# Patient Record
Sex: Female | Born: 1970
Health system: Southern US, Community
[De-identification: ages and names within clinical notes are randomized; demographics above are authoritative.]

## PROBLEM LIST (undated history)

## (undated) DIAGNOSIS — Z9289 Personal history of other medical treatment: Secondary | ICD-10-CM

## (undated) DIAGNOSIS — K219 Gastro-esophageal reflux disease without esophagitis: Secondary | ICD-10-CM

## (undated) DIAGNOSIS — F419 Anxiety disorder, unspecified: Secondary | ICD-10-CM

## (undated) DIAGNOSIS — I10 Essential (primary) hypertension: Secondary | ICD-10-CM

## (undated) DIAGNOSIS — Z973 Presence of spectacles and contact lenses: Secondary | ICD-10-CM

## (undated) DIAGNOSIS — Z01419 Encounter for gynecological examination (general) (routine) without abnormal findings: Secondary | ICD-10-CM

## (undated) HISTORY — DX: Personal history of other medical treatment: Z92.89

## (undated) HISTORY — PX: CHOLECYSTECTOMY: SHX55

## (undated) HISTORY — PX: BREAST BIOPSY: SHX20

## (undated) HISTORY — DX: Presence of spectacles and contact lenses: Z97.3

## (undated) HISTORY — DX: Gastro-esophageal reflux disease without esophagitis: K21.9

## (undated) HISTORY — DX: Essential (primary) hypertension: I10

## (undated) HISTORY — DX: Encounter for gynecological examination (general) (routine) without abnormal findings: Z01.419

## (undated) HISTORY — PX: WISDOM TOOTH EXTRACTION: SHX21

---

## 2003-10-31 ENCOUNTER — Encounter (INDEPENDENT_AMBULATORY_CARE_PROVIDER_SITE_OTHER): Payer: Self-pay | Admitting: Specialist

## 2003-10-31 ENCOUNTER — Observation Stay (HOSPITAL_COMMUNITY): Admission: RE | Admit: 2003-10-31 | Discharge: 2003-11-01 | Payer: Self-pay | Admitting: General Surgery

## 2003-10-31 ENCOUNTER — Encounter (INDEPENDENT_AMBULATORY_CARE_PROVIDER_SITE_OTHER): Payer: Self-pay | Admitting: *Deleted

## 2004-02-04 ENCOUNTER — Other Ambulatory Visit: Admission: RE | Admit: 2004-02-04 | Discharge: 2004-02-04 | Payer: Self-pay | Admitting: Obstetrics and Gynecology

## 2005-05-12 ENCOUNTER — Other Ambulatory Visit: Admission: RE | Admit: 2005-05-12 | Discharge: 2005-05-12 | Payer: Self-pay | Admitting: Obstetrics and Gynecology

## 2009-12-28 HISTORY — PX: COLONOSCOPY: SHX174

## 2010-07-28 ENCOUNTER — Encounter: Payer: Self-pay | Admitting: Gastroenterology

## 2010-08-27 ENCOUNTER — Ambulatory Visit: Payer: Self-pay | Admitting: Gastroenterology

## 2010-08-27 DIAGNOSIS — R197 Diarrhea, unspecified: Secondary | ICD-10-CM | POA: Insufficient documentation

## 2010-08-27 DIAGNOSIS — R1032 Left lower quadrant pain: Secondary | ICD-10-CM | POA: Insufficient documentation

## 2010-08-27 LAB — CONVERTED CEMR LAB
ALT: 27 units/L (ref 0–35)
AST: 22 units/L (ref 0–37)
Albumin: 4.1 g/dL (ref 3.5–5.2)
Alkaline Phosphatase: 114 units/L (ref 39–117)
BUN: 10 mg/dL (ref 6–23)
CO2: 29 meq/L (ref 19–32)
CRP, High Sensitivity: 16.06 — ABNORMAL HIGH (ref 0.00–5.00)
Calcium: 9.6 mg/dL (ref 8.4–10.5)
Chloride: 104 meq/L (ref 96–112)
Creatinine, Ser: 0.8 mg/dL (ref 0.4–1.2)
GFR calc non Af Amer: 91.41 mL/min (ref >60)
Glucose, Bld: 87 mg/dL (ref 70–99)
Potassium: 4.4 meq/L (ref 3.5–5.1)
Sed Rate: 35 mm/hr — ABNORMAL HIGH (ref 0–22)
Sodium: 141 meq/L (ref 135–145)
TSH: 1.13 microintl units/mL (ref 0.35–5.50)
Total Bilirubin: 0.7 mg/dL (ref 0.3–1.2)
Total Protein: 7.4 g/dL (ref 6.0–8.3)

## 2010-08-29 ENCOUNTER — Ambulatory Visit: Payer: Self-pay | Admitting: Gastroenterology

## 2010-09-03 ENCOUNTER — Encounter: Payer: Self-pay | Admitting: Gastroenterology

## 2011-01-27 NOTE — Letter (Signed)
Summary: Patient Notice-Hyperplastic Polyps  [facility]  [address] [place], [place] [address]   Phone: [phone]  Fax: [phone]        September 03, 2010 MRN: [id number]    Jennifer Leonard [address] [place], [place]  [address]    Dear Ms. Ziesmer,  I am pleased to inform you that the colon polyp(s) removed during your recent colonoscopy was (were) found to be hyperplastic. These types of polyps are NOT pre-cancerous. The colonic biopsies were normal.  It is my recommendation that you have a repeat colonoscopy examination at age 33 years for routine colorectal cancer screening.  Should you develop new or worsening symptoms of abdominal pain, bowel habit changes or bleeding from the rectum or bowels, please schedule an evaluation with either your primary care physician or with me.  Continue treatment plan as outlined the day of your exam.  Please call us if you are having persistent problems or have questions about your condition that have not been fully answered at this time.  Sincerely,  Meryl Dare MD [facility]  This letter has been electronically signed by your physician.  Appended Document: Patient Notice-Hyperplastic Polyps letter mailed

## 2011-01-27 NOTE — Letter (Signed)
Summary: [facility] Instructions  [facility]  [address] [place], [place] [address]   Phone: [phone]  Fax: [phone]       Jennifer Leonard    03-Jul-1971    MRN: [id number]        Procedure Day /Date: Friday September 2nd, 2011     Arrival Time: 10:00am      Procedure Time: 11:00am     Location of Procedure:                    _x _   [facility]oscopy Center (4th Floor)                        PREPARATION FOR COLONOSCOPY WITH MOVIPREP   Starting 5 days prior to your procedure today do not eat nuts, seeds, popcorn, corn, beans, peas,  salads, or any raw vegetables.  Do not take any fiber supplements (e.g. Metamucil, Citrucel, and Benefiber).  THE DAY BEFORE YOUR PROCEDURE         DATE: 08/28/10  DAY: Thursday  1.  Drink clear liquids the entire day-NO SOLID FOOD  2.  Do not drink anything colored red or purple.  Avoid juices with pulp.  No orange juice.  3.  Drink at least 64 oz. (8 glasses) of fluid/clear liquids during the day to prevent dehydration and help the prep work efficiently.  CLEAR LIQUIDS INCLUDE: Water Jello Ice Popsicles Tea (sugar ok, no milk/cream) Powdered fruit flavored drinks Coffee (sugar ok, no milk/cream) Gatorade Juice: apple, white grape, white cranberry  Lemonade Clear bullion, consomm, broth Carbonated beverages (any kind) Strained chicken noodle soup Hard Candy                             4.  In the morning, mix first dose of MoviPrep solution:    Empty 1 Pouch A and 1 Pouch B into the disposable container    Add lukewarm drinking water to the top line of the container. Mix to dissolve    Refrigerate (mixed solution should be used within 24 hrs)  5.  Begin drinking the prep at 5:00 p.m. The MoviPrep container is divided by 4 marks.   Every 15 minutes drink the solution down to the next mark (approximately 8 oz) until the full liter is complete.   6.  Follow completed prep with 16 oz of clear liquid of your  choice (Nothing red or purple).  Continue to drink clear liquids until bedtime.  7.  Before going to bed, mix second dose of MoviPrep solution:    Empty 1 Pouch A and 1 Pouch B into the disposable container    Add lukewarm drinking water to the top line of the container. Mix to dissolve    Refrigerate  THE DAY OF YOUR PROCEDURE      DATE: 08/29/10 DAY: Friday  Beginning at 6:00 a.m. (5 hours before procedure):         1. Every 15 minutes, drink the solution down to the next mark (approx 8 oz) until the full liter is complete.  2. Follow completed prep with 16 oz. of clear liquid of your choice.    3. You may drink clear liquids until 9:00am (2 HOURS BEFORE PROCEDURE).   MEDICATION INSTRUCTIONS  Unless otherwise instructed, you should take regular prescription medications with a small sip of water   as early as possible the morning of  your procedure.        OTHER INSTRUCTIONS  You will need a responsible adult at least 40 years of age to accompany you and drive you home.   This person must remain in the waiting room during your procedure.  Wear loose fitting clothing that is easily removed.  Leave jewelry and other valuables at home.  However, you may wish to bring a book to read or  an iPod/MP3 player to listen to music as you wait for your procedure to start.  Remove all body piercing jewelry and leave at home.  Total time from sign-in until discharge is approximately 2-3 hours.  You should go home directly after your procedure and rest.  You can resume normal activities the  day after your procedure.  The day of your procedure you should not:   Drive   Make legal decisions   Operate machinery   Drink alcohol   Return to work  You will receive specific instructions about eating, activities and medications before you leave.    The above instructions have been reviewed and explained to me by   Estill Bamberg.     I fully understand and can verbalize these  instructions _____________________________ Date _________

## 2011-01-27 NOTE — Assessment & Plan Note (Signed)
Summary: POSS DIVERTICULOSIS/JMS   History of Present Illness Visit Type: Initial Visit Primary GI MD: Elie Goody MD [facility] Primary Provider: Marcelle Overlie, MD Chief Complaint: Patient developed some diarrhea and abdominal pain, she was seen at urgent care. Her problems started when she started a diet and eating better. She ate some almonds and her abdominal pain starts after she eats them.  History of Present Illness:   This is a 40 year old female with a lifelong history of intermittent, urgent, postprandial diarrhea. Her symptoms worsened following her cholecystectomy in 2004. Over the past several weeks, she has had recurrent episodes of left lower quadrant pain and occasional right lower quadrant pain associated with bloating. [facility] was [facility] on August 1st where an acute abdominal series, CBC and urinalysis were unremarkable and she was treated with an spasmodic. She states that almonds precipitated left lower quadrant pain on 2 occasions.   GI Review of Systems    Reports abdominal pain and  bloating.     Location of  Abdominal pain: LLQ.    Denies acid reflux, belching, chest pain, dysphagia with liquids, dysphagia with solids, heartburn, loss of appetite, nausea, vomiting, vomiting blood, weight loss, and  weight gain.      Reports diarrhea.     Denies anal fissure, black tarry stools, change in bowel habit, constipation, diverticulosis, fecal incontinence, heme positive stool, hemorrhoids, irritable bowel syndrome, jaundice, light color stool, liver problems, rectal bleeding, and  rectal pain. Preventive Screening-Counseling & Management  Alcohol-Tobacco     Smoking Status: never      Drug Use:  no.     Current Medications (verified): 1)  Hyomax-Sr 0.375 Mg Xr12h-Tab (Hyoscyamine Sulfate) .... Take One By Mouth As Needed 2)  Alprazolam 0.5 Mg Tabs (Alprazolam) .... Take One By Mouth As Needed  Allergies (verified): 1)  ! Sulfa  Past  History:  Past Medical History: Depression  Past Surgical History: Cholecystectomy with IOC 2004  Family History: No FH of Colon Cancer: Family History of Breast Cancer:maternal grandmother, maternal aunt Maternal Grandfather: Lung cancer (smoker)  Social History: Patient has never smoked.  Alcohol Use - yes social Daily Caffeine Use seldom Illicit Drug Use - no Smoking Status:  never Drug Use:  no  Review of Systems       The pertinent positives and negatives are noted as above and in the HPI. All other ROS were reviewed and were negative.   Vital Signs:  Patient profile:   40 year old female Height:      65 inches Weight:      214.4 pounds BMI:     35.81 Pulse rate:   60 / minute Pulse rhythm:   regular BP sitting:   138 / 72  (left arm) Cuff size:   regular  Vitals Entered By: Harlow Mares CMA Duncan Dull) (August 27, 2010 10:24 AM)  Physical Exam  General:  Well developed, well nourished, no acute distress. obese.   Head:  Normocephalic and atraumatic. Eyes:  PERRLA, no icterus. Ears:  Normal auditory acuity. Mouth:  No deformity or lesions, dentition normal. Neck:  Supple; no masses or thyromegaly. Lungs:  Clear throughout to auscultation. Heart:  Regular rate and rhythm; no murmurs, rubs,  or bruits. Abdomen:  Soft, nontender and nondistended. No masses, hepatosplenomegaly or hernias noted. Normal bowel sounds. Rectal:  Normal exam. hemocult negative.   Msk:  Symmetrical with no gross deformities. Normal posture. Pulses:  Normal pulses noted. Extremities:  No  clubbing, cyanosis, edema or deformities noted. Neurologic:  Alert and  oriented x4;  grossly normal neurologically. Cervical Nodes:  No significant cervical adenopathy. Inguinal Nodes:  No significant inguinal adenopathy. Psych:  Alert and cooperative. Normal mood and affect.  Impression & Recommendations:  Problem # 1:  ABDOMINAL PAIN, LEFT LOWER QUADRANT (ICD-789.04) Recurrent left lower quadrant  abdominal pain, associated with bloating and urgent postprandial diarrhea. I suspect she has irritable bowel syndrome. However, her symptoms have worsened and are now associated with pain. Rule out inflammatory bowel disease diverticulosis, and other disorders. The risks, benefits and alternatives to colonoscopy with possible biopsy and possible polypectomy were discussed with the patient and they consent to proceed. The procedure will be scheduled electively. Continue anti-spasmodic twice daily. Avoid almonds and other foods that trigger symptoms. Orders: Colonoscopy (Colon) TLB-CMP (Comprehensive Metabolic Pnl) (80053-COMP) TLB-CRP-High Sensitivity (C-Reactive Protein) (86140-FCRP) TLB-TSH (Thyroid Stimulating Hormone) (84443-TSH) TLB-Sedimentation Rate (ESR) (85652-ESR)  Problem # 2:  DIARRHEA (ICD-787.91) As in problem #1.  Patient Instructions: 1)  Get your labs drawn today in the basement.  2)  Colonoscopy brochure given.  3)  Copy sent to : Marcelle Overlie, MD 4)  The medication list was reviewed and reconciled.  All changed / newly prescribed medications were explained.  A complete medication list was provided to the patient / caregiver.  Prescriptions: MOVIPREP 100 GM  SOLR (PEG-KCL-NACL-NASULF-NA ASC-C) As per prep instructions.  #1 x 0   Entered by:   Christie Nottingham CMA (AAMA)   Authorized by:   Meryl Dare MD [facility]   Signed by:   Meryl Dare MD FACG on 08/27/2010   Method used:   Electronically to        [facility] [address] (retail)       [address]       [place], [place]  [address]       Ph: [phone]       Fax: [phone]   RxID:   [id number]

## 2011-01-27 NOTE — Op Note (Signed)
Summary: Laparoscopic Cholecystectomy    NAME:  Jennifer Leonard, Jennifer Leonard                       ACCOUNT NO.:  [id number]   MEDICAL RECORD NO.:  [id number]                   PATIENT TYPE:  AMB   LOCATION:  DAY                                  FACILITY:  [facility]   PHYSICIAN:  Gita Kudo, M.D.              DATE OF BIRTH:  27-Oct-1971   DATE OF PROCEDURE:  10/31/2003  DATE OF DISCHARGE:                                 OPERATIVE REPORT   PREOPERATIVE DIAGNOSIS:  Gallstones.   POSTOPERATIVE DIAGNOSES:  Gallstones, normal cholangiogram.   OPERATIVE PROCEDURE:  Laparoscopic cholecystectomy with intraoperative  cholangiogram.   SURGEON:  Gita Kudo, M.D.   ASSISTANT:  Ollen Gross. Carolynne Edouard, M.D.   ANESTHESIA:  General endotracheal.   CLINICAL SUMMARY:  A 40 year old lady with abdominal pain and gallbladder  ultrasound showing many small stones.  Liver functions normal except slight  elevation of alkaline phosphatase.   OPERATIVE FINDINGS:  The gallbladder was thin-walled and not acutely  infected or inflamed.  The cystic duct and artery normal in size and  anatomy.  Cholangiogram appeared normal.   OPERATIVE PROCEDURE:  Under satisfactory general endotracheal anesthesia,  having received 1.0 g Ancef, the patient's abdomen prepped and draped in a  standard fashion.  Through a Marcaine-infiltrated skin incision, the  following ports were placed.  Above the umbilicus a transverse incision  made, the midline entered into the peritoneum and controlled with a figure-  of-eight 0 Vicryl suture.  Operating Hasson port inserted and secured.  Good  CO2 pneumoperitoneum established and then camera placed and under direct  vision two #5 ports placed laterally and a second #10 medially.  Lateral  graspers afforded excellent exposure and the cystic duct and artery were  each circumferentially dissected after taking down filmy adhesions of the  gallbladder.  When certain of these structures' anatomy, the  artery was  controlled with multiple clips and a single clip placed in the cystic duct  near the gallbladder.  Incision made in the duct and a percutaneously-placed  catheter used to obtain good cholangiograms and then removed.  The duct was  then controlled with multiple clips and it and the artery transected.  Following this the gallbladder was removed from below upward using  coagulating dissector for hemostasis and dissection.  After the gallbladder  was removed from the liver bed, the bed was made dry by cautery, lavaged  with saline, and noted to be hemostatic.  The gallbladder was then removed  through the umbilical port with a large grasper after the camera had been  moved to the upper port.  There was no spillage or complication.  The  operative site was again checked for hemostasis and lavaged with saline and  suctioned dry.  Ports were removed under direct vision and then the CO2  released.  The midline was closed with a previous figure-of-eight and a  second interrupted 0 Vicryl suture.  All  subcu approximated with 4-0 Vicryl and Steri-Strips used to approximate the  skin.  There were no complications.  Sponge and needle counts correct.  The  patient tolerated the procedure well and went to the recovery room in good  condition without complication.                                               Gita Kudo, M.D.    MRL/MEDQ  D:  10/31/2003  T:  10/31/2003  Job:  [id number]   cc:   Louanna Raw  [address]  [place]  [place] [address]  Fax: [phone]         SP Surgical Pathology - STATUS: Final             By: Morrie Sheldon,       Perform Date: 3 Nov04 00:00  Ordered By: Maryagnes Amos MD , Suszanne Conners          Ordered Date: 3 Nov04 14:45  Facility: [facility]                              Department: CPATH  Service Report Text  [facility]   [address] [place], [place] [address]   [phone]    REPORT OF SURGICAL PATHOLOGY     Case #: [id number]   Patient Name: Jennifer Leonard, Jennifer Leonard   PID: [id number]   Pathologist: Beulah Gandy. Luisa Hart, MD   DOB/Age Mar 02, 1971 (Age: 28) Gender: F   Date Taken: 10/31/2003   Date Received: 10/31/2003    FINAL DIAGNOSIS    ***MICROSCOPIC EXAMINATION AND DIAGNOSIS***    GALLBLADDER: CHRONIC CHOLECYSTITIS AND CHOLESTEROLOSIS.    COMMENT   No gallstones are identified within the gallbladder or specimen   container. (JDP:gt) 11/01/03    gdt   Date Reported: 11/01/2003 Beulah Gandy. Luisa Hart, MD   *** Electronically Signed Out By JDP ***    Clinical information   Gallstones (mw)    specimen(s) obtained   Gallbladder    Gross Description   Size/?Intact: 7.3 x 2.6 x 2 cm, intact   Serosal surface: Pink, smooth   Mucosa/Wall: Mucosa hyperemic, soft with diffuse yellow   streaking; wall 0.2 cm thick   Contents: Bile, no gross stones   Cystic duct: Patent   Block Summary: One block (SW:caf 10/31/03)    cf/

## 2011-01-27 NOTE — Procedures (Signed)
Summary: Colonoscopy  Patient: Jennifer Leonard Note: All result statuses are Final unless otherwise noted.  Tests: (1) Colonoscopy (COL)   COL Colonoscopy           DONE     [facility]     [address]     [place], [place]  [address]           COLONOSCOPY PROCEDURE REPORT     PATIENT:  [name], [name]  MR#:  [id number]     BIRTHDATE:  Apr 29, 1971, 39 yrs. old  GENDER:  female     ENDOSCOPIST:  Judie Petit T. Russella Dar, MD, [facility]           PROCEDURE DATE:  08/29/2010     PROCEDURE:  Colonoscopy with biopsy and snare polypectomy     ASA CLASS:  Class II     INDICATIONS:  1) unexplained diarrhea  2) Abdominal pain     MEDICATIONS:   Fentanyl 75 mcg IV, Versed 7 mg IV     DESCRIPTION OF PROCEDURE:   After the risks benefits and     alternatives of the procedure were thoroughly explained, informed     consent was obtained.  Digital rectal exam was performed and     revealed no abnormalities.   The LB [id number]-[id number] endoscope     was introduced through the anus and advanced to the terminal ileum     which was intubated for a short distance, without limitations.     The quality of the prep was excellent, using MoviPrep.  The     instrument was then slowly withdrawn as the colon was fully     examined.     <<PROCEDUREIMAGES>>     FINDINGS:  A normal appearing cecum, ileocecal valve, and     appendiceal orifice were identified. The ascending, hepatic     flexure, transverse, splenic flexure, descending colon, and rectum     appeared unremarkable. Random biopsies were obtained and sent to     pathology.  The terminal ileum appeared normal. A sessile polyp     was found in the sigmoid colon. It was 5 mm in size. Polyp was     snared without cautery. Retrieval was successful. Retroflexed     views in the rectum revealed no abnormalities.  The time to cecum     =  1  minutes. The scope was then withdrawn (time =  9.25  min)     from the patient and the procedure completed.         COMPLICATIONS:  None           ENDOSCOPIC IMPRESSION:     1) Normal terminal ileum     2) 5 mm sessile polyp in the sigmoid colon     RECOMMENDATIONS:     1) Await pathology results     2) High fiber diet with liberal fluid intake.     3) If the polyp is adenomatous (pre-cancerous), you will need a     repeat colonoscopy in 5 years. Otherwise you should continue to     follow colorectal cancer screening guidelines for "routine risk"     patients with colonoscopy in 10 years.     4) call office next 1-3 days to schedule followup visit in 4     weeks           Brittni Hult T. Russella Dar, MD, [facility]           CC: Marcelle Overlie,  MD           n.     Rosalie DoctorJudie Petit T. Kawena Lyday at 08/29/2010 11:47 AM           Josem Kaufmann, [id number]  Note: An exclamation mark (!) indicates a result that was not dispersed into the flowsheet. Document Creation Date: 08/29/2010 11:47 AM _______________________________________________________________________  (1) Order result status: Final Collection or observation date-time: 08/29/2010 11:41 Requested date-time:  Receipt date-time:  Reported date-time:  Referring Physician:   Ordering Physician: Claudette Head [phone]) Specimen Source:  Source: Launa Grill Order Number: [id number] Lab site:   Appended Document: Colonoscopy     Procedures Next Due Date:    Colonoscopy: 07/2021

## 2011-05-15 NOTE — Op Note (Signed)
NAMEPURVA, VESSELL                       ACCOUNT NO.:  [id number]   MEDICAL RECORD NO.:  [id number]                   PATIENT TYPE:  AMB   LOCATION:  DAY                                  FACILITY:  [facility]   PHYSICIAN:  Gita Kudo, M.D.              DATE OF BIRTH:  07/24/1971   DATE OF PROCEDURE:  10/31/2003  DATE OF DISCHARGE:                                 OPERATIVE REPORT   PREOPERATIVE DIAGNOSIS:  Gallstones.   POSTOPERATIVE DIAGNOSES:  Gallstones, normal cholangiogram.   OPERATIVE PROCEDURE:  Laparoscopic cholecystectomy with intraoperative  cholangiogram.   SURGEON:  Gita Kudo, M.D.   ASSISTANT:  Ollen Gross. Carolynne Edouard, M.D.   ANESTHESIA:  General endotracheal.   CLINICAL SUMMARY:  A 40 year old lady with abdominal pain and gallbladder  ultrasound showing many small stones.  Liver functions normal except slight  elevation of alkaline phosphatase.   OPERATIVE FINDINGS:  The gallbladder was thin-walled and not acutely  infected or inflamed.  The cystic duct and artery normal in size and  anatomy.  Cholangiogram appeared normal.   OPERATIVE PROCEDURE:  Under satisfactory general endotracheal anesthesia,  having received 1.0 g Ancef, the patient's abdomen prepped and draped in a  standard fashion.  Through a Marcaine-infiltrated skin incision, the  following ports were placed.  Above the umbilicus a transverse incision  made, the midline entered into the peritoneum and controlled with a figure-  of-eight 0 Vicryl suture.  Operating Hasson port inserted and secured.  Good  CO2 pneumoperitoneum established and then camera placed and under direct  vision two #5 ports placed laterally and a second #10 medially.  Lateral  graspers afforded excellent exposure and the cystic duct and artery were  each circumferentially dissected after taking down filmy adhesions of the  gallbladder.  When certain of these structures' anatomy, the artery was  controlled with multiple  clips and a single clip placed in the cystic duct  near the gallbladder.  Incision made in the duct and a percutaneously-placed  catheter used to obtain good cholangiograms and then removed.  The duct was  then controlled with multiple clips and it and the artery transected.  Following this the gallbladder was removed from below upward using  coagulating dissector for hemostasis and dissection.  After the gallbladder  was removed from the liver bed, the bed was made dry by cautery, lavaged  with saline, and noted to be hemostatic.  The gallbladder was then removed  through the umbilical port with a large grasper after the camera had been  moved to the upper port.  There was no spillage or complication.  The  operative site was again checked for hemostasis and lavaged with saline and  suctioned dry.  Ports were removed under direct vision and then the CO2  released.  The midline was closed with a previous figure-of-eight and a  second interrupted 0 Vicryl suture.  All  subcu approximated  with 4-0 Vicryl and Steri-Strips used to approximate the  skin.  There were no complications.  Sponge and needle counts correct.  The  patient tolerated the procedure well and went to the recovery room in good  condition without complication.                                               Gita Kudo, M.D.    MRL/MEDQ  D:  10/31/2003  T:  10/31/2003  Job:  [id number]   cc:   Louanna Raw  [address]  [place]  [place] [address]  Fax: [phone]

## 2012-04-04 ENCOUNTER — Encounter (HOSPITAL_COMMUNITY): Payer: Self-pay | Admitting: Emergency Medicine

## 2012-04-04 ENCOUNTER — Emergency Department (HOSPITAL_COMMUNITY)
Admission: EM | Admit: 2012-04-04 | Discharge: 2012-04-04 | Disposition: A | Discharge status: Home / Self Care | Payer: 59 | Attending: Emergency Medicine | Admitting: Emergency Medicine

## 2012-04-04 ENCOUNTER — Emergency Department (HOSPITAL_COMMUNITY): Payer: 59

## 2012-04-04 ENCOUNTER — Other Ambulatory Visit: Payer: Self-pay

## 2012-04-04 DIAGNOSIS — F411 Generalized anxiety disorder: Secondary | ICD-10-CM | POA: Insufficient documentation

## 2012-04-04 DIAGNOSIS — R079 Chest pain, unspecified: Secondary | ICD-10-CM | POA: Insufficient documentation

## 2012-04-04 HISTORY — DX: Anxiety disorder, unspecified: F41.9

## 2012-04-04 LAB — COMPREHENSIVE METABOLIC PANEL
ALT: 19 U/L (ref 0–35)
AST: 20 U/L (ref 0–37)
Albumin: 3.7 g/dL (ref 3.5–5.2)
Alkaline Phosphatase: 121 U/L — ABNORMAL HIGH (ref 39–117)
BUN: 7 mg/dL (ref 6–23)
CO2: 23 mEq/L (ref 19–32)
Calcium: 9.3 mg/dL (ref 8.4–10.5)
Chloride: 106 mEq/L (ref 96–112)
Creatinine, Ser: 0.74 mg/dL (ref 0.50–1.10)
GFR calc Af Amer: >90 mL/min (ref >90)
GFR calc non Af Amer: >90 mL/min (ref >90)
Glucose, Bld: 95 mg/dL (ref 70–99)
Potassium: 3.8 mEq/L (ref 3.5–5.1)
Sodium: 141 mEq/L (ref 135–145)
Total Bilirubin: 0.2 mg/dL — ABNORMAL LOW (ref 0.3–1.2)
Total Protein: 7.2 g/dL (ref 6.0–8.3)

## 2012-04-04 LAB — DIFFERENTIAL
Basophils Absolute: 0.1 10*3/uL (ref 0.0–0.1)
Basophils Relative: 1 % (ref 0–1)
Eosinophils Absolute: 0.3 10*3/uL (ref 0.0–0.7)
Eosinophils Relative: 4 % (ref 0–5)
Lymphocytes Relative: 36 % (ref 12–46)
Lymphs Abs: 2.7 10*3/uL (ref 0.7–4.0)
Monocytes Absolute: 0.5 10*3/uL (ref 0.1–1.0)
Monocytes Relative: 7 % (ref 3–12)
Neutro Abs: 3.9 10*3/uL (ref 1.7–7.7)
Neutrophils Relative %: 52 % (ref 43–77)

## 2012-04-04 LAB — CBC
HCT: 37.7 % (ref 36.0–46.0)
Hemoglobin: 12.6 g/dL (ref 12.0–15.0)
MCH: 28.3 pg (ref 26.0–34.0)
MCHC: 33.4 g/dL (ref 30.0–36.0)
MCV: 84.7 fL (ref 78.0–100.0)
Platelets: 279 10*3/uL (ref 150–400)
RBC: 4.45 MIL/uL (ref 3.87–5.11)
RDW: 13.1 % (ref 11.5–15.5)
WBC: 7.5 10*3/uL (ref 4.0–10.5)

## 2012-04-04 LAB — POCT I-STAT TROPONIN I: Troponin i, poc: 0 ng/mL (ref 0.00–0.08)

## 2012-04-04 LAB — D-DIMER, QUANTITATIVE: D-Dimer, Quant: 0.23 ug/mL-FEU (ref 0.00–0.48)

## 2012-04-04 MED ORDER — NITROGLYCERIN 0.4 MG SL SUBL
0.4000 mg | SUBLINGUAL_TABLET | Freq: Once | SUBLINGUAL | Status: AC
  Administered 2012-04-04: 0.4 mg via SUBLINGUAL
  Filled 2012-04-04: qty 25

## 2012-04-04 NOTE — Discharge Instructions (Signed)
Chest Pain (Nonspecific) It is often hard to give a specific diagnosis for the cause of chest pain. There is always a chance that your pain could be related to something serious, such as a heart attack or a blood clot in the lungs. You need to follow up with your caregiver for further evaluation. CAUSES   Heartburn.   Pneumonia or bronchitis.   Anxiety or stress.   Inflammation around your heart (pericarditis) or lung (pleuritis or pleurisy).   A blood clot in the lung.   A collapsed lung (pneumothorax). It can develop suddenly on its own (spontaneous pneumothorax) or from injury (trauma) to the chest.   Shingles infection (herpes zoster virus).  The chest wall is composed of bones, muscles, and cartilage. Any of these can be the source of the pain.  The bones can be bruised by injury.   The muscles or cartilage can be strained by coughing or overwork.   The cartilage can be affected by inflammation and become sore (costochondritis).  DIAGNOSIS  Lab tests or other studies, such as X-rays, electrocardiography, stress testing, or cardiac imaging, may be needed to find the cause of your pain.  TREATMENT   Treatment depends on what may be causing your chest pain. Treatment may include:   Acid blockers for heartburn.   Anti-inflammatory medicine.   Pain medicine for inflammatory conditions.   Antibiotics if an infection is present.   You may be advised to change lifestyle habits. This includes stopping smoking and avoiding alcohol, caffeine, and chocolate.   You may be advised to keep your head raised (elevated) when sleeping. This reduces the chance of acid going backward from your stomach into your esophagus.   Most of the time, nonspecific chest pain will improve within 2 to 3 days with rest and mild pain medicine.  HOME CARE INSTRUCTIONS   If antibiotics were prescribed, take your antibiotics as directed. Finish them even if you start to feel better.   For the next few  days, avoid physical activities that bring on chest pain. Continue physical activities as directed.   Do not smoke.   Avoid drinking alcohol.   Only take over-the-counter or prescription medicine for pain, discomfort, or fever as directed by your caregiver.   Follow your caregiver's suggestions for further testing if your chest pain does not go away.   Keep any follow-up appointments you made. If you do not go to an appointment, you could develop lasting (chronic) problems with pain. If there is any problem keeping an appointment, you must call to reschedule.  SEEK MEDICAL CARE IF:   You think you are having problems from the medicine you are taking. Read your medicine instructions carefully.   Your chest pain does not go away, even after treatment.   You develop a rash with blisters on your chest.  SEEK IMMEDIATE MEDICAL CARE IF:   You have increased chest pain or pain that spreads to your arm, neck, jaw, back, or abdomen.   You develop shortness of breath, an increasing cough, or you are coughing up blood.   You have severe back or abdominal pain, feel nauseous, or vomit.   You develop severe weakness, fainting, or chills.   You have a fever.  THIS IS AN EMERGENCY. Do not wait to see if the pain will go away. Get medical help at once. Call your local emergency services (911 in [place] Do not drive yourself to the hospital. MAKE SURE YOU:   Understand these instructions.     Will watch your condition.   Will get help right away if you are not doing well or get worse.  Document Released: 09/23/2005 Document Revised: 12/03/2011 Document Reviewed: 07/19/2008 [facility] Patient Information 2012 [place], [place].  I discussed your case with a cardiologist. They will call you tomorrow for an appointment. Phone number given of cardiologist

## 2012-04-04 NOTE — ED Provider Notes (Signed)
History     CSN: [id number]  Arrival date & time 04/04/12  1355   First MD Initiated Contact with Patient 04/04/12 1508      Chief Complaint  Patient presents with  . Palpitations    (Consider location/radiation/quality/duration/timing/severity/associated sxs/prior treatment) HPI.... chest pain for approximately one week described as sharp and constant. Symptoms radiate to left upper extremity.  Feels like her heart is beating fast. Nothing makes symptoms better or worse. No dyspnea, nausea, diaphoresis. No previous history of heart trouble. Family history negative for cardiac disease. Nonsmoker. Normal cholesterol  Past Medical History  Diagnosis Date  . Anxiety     Past Surgical History  Procedure Date  . Cholecystectomy     No family history on file.  History  Substance Use Topics  . Smoking status: Never Smoker   . Smokeless tobacco: Not on file  . Alcohol Use: Yes     socially    OB History    Grav Para Term Preterm Abortions TAB SAB Ect Mult Living                  Review of Systems  All other systems reviewed and are negative.    Allergies  Sulfonamide derivatives  Home Medications   Current Outpatient Rx  Name Route Sig Dispense Refill  . ALPRAZOLAM 0.5 MG PO TABS Oral Take 0.25 mg by mouth 4 (four) times daily.    . IBUPROFEN 200 MG PO TABS Oral Take 400 mg by mouth every 6 (six) hours as needed. pain      BP 157/93  Pulse 65  Temp(Src) 98.6 F (37 C) (Oral)  Resp 20  SpO2 98%  LMP 03/23/2012  Physical Exam  Nursing note and vitals reviewed. Constitutional: She is oriented to person, place, and time. She appears well-developed and well-nourished.  HENT:  Head: Normocephalic and atraumatic.  Eyes: Conjunctivae and EOM are normal. Pupils are equal, round, and reactive to light.  Neck: Normal range of motion. Neck supple.  Cardiovascular: Normal rate and regular rhythm.   Pulmonary/Chest: Effort normal and breath sounds normal.    Abdominal: Soft. Bowel sounds are normal.  Musculoskeletal: Normal range of motion.  Neurological: She is alert and oriented to person, place, and time.  Skin: Skin is warm and dry.  Psychiatric: She has a normal mood and affect.    ED Course  Procedures (including critical care time)   Labs Reviewed  CBC  DIFFERENTIAL  COMPREHENSIVE METABOLIC PANEL  D-DIMER, QUANTITATIVE   No results found.  Date: 04/04/2012  Rate: 65  Rhythm: normal sinus rhythm  QRS Axis: normal  Intervals: normal  ST/T Wave abnormalities: normal  Conduction Disutrbances: none  Narrative Interpretation: unremarkable     No diagnosis found.    MDM  Patient is low risk for ACS or pulmonary embolus. Will do screening tests. Probable referral to cardiology.  1835: Discussed history with [facility] cardiologist.  Will see patient as an outpatient.      Donnetta Hutching, MD 04/04/12 1901

## 2012-04-04 NOTE — ED Notes (Signed)
Pt presenting to ed with c/o left arm pain and dull ache x 1 week with heart palpitations. Pt states some nausea no vomiting. Pt denies shortness of breath. Pt is alert and oriented at this time. Pt is in nad

## 2012-04-27 ENCOUNTER — Other Ambulatory Visit: Payer: Self-pay | Admitting: Obstetrics and Gynecology

## 2013-12-12 ENCOUNTER — Other Ambulatory Visit: Payer: Self-pay | Admitting: Obstetrics and Gynecology

## 2014-04-16 ENCOUNTER — Ambulatory Visit (INDEPENDENT_AMBULATORY_CARE_PROVIDER_SITE_OTHER): Payer: 59 | Admitting: Medical

## 2014-04-16 ENCOUNTER — Encounter: Payer: Self-pay | Admitting: Medical

## 2014-04-16 VITALS — BP 142/90 | HR 62 | Temp 98.4°F | Resp 14 | Ht 64.0 in | Wt 206.0 lb

## 2014-04-16 DIAGNOSIS — F411 Generalized anxiety disorder: Secondary | ICD-10-CM

## 2014-04-16 DIAGNOSIS — E669 Obesity, unspecified: Secondary | ICD-10-CM

## 2014-04-16 DIAGNOSIS — R030 Elevated blood-pressure reading, without diagnosis of hypertension: Secondary | ICD-10-CM

## 2014-04-16 DIAGNOSIS — Z0000 Encounter for general adult medical examination without abnormal findings: Secondary | ICD-10-CM

## 2014-04-16 DIAGNOSIS — Z23 Encounter for immunization: Secondary | ICD-10-CM

## 2014-04-16 LAB — CBC
HCT: 41.7 % (ref 36.0–46.0)
Hemoglobin: 14 g/dL (ref 12.0–15.0)
MCH: 28 pg (ref 26.0–34.0)
MCHC: 33.6 g/dL (ref 30.0–36.0)
MCV: 83.4 fL (ref 78.0–100.0)
Platelets: 284 10*3/uL (ref 150–400)
RBC: 5 MIL/uL (ref 3.87–5.11)
RDW: 13.3 % (ref 11.5–15.5)
WBC: 8.4 10*3/uL (ref 4.0–10.5)

## 2014-04-16 LAB — POCT URINALYSIS DIPSTICK
Bilirubin, UA: NEGATIVE
Blood, UA: NEGATIVE
Glucose, UA: NEGATIVE
Ketones, UA: NEGATIVE
Leukocytes, UA: NEGATIVE
Nitrite, UA: NEGATIVE
Spec Grav, UA: 1.015
Urobilinogen, UA: NEGATIVE
pH, UA: 5

## 2014-04-16 NOTE — Addendum Note (Signed)
Addended by: Christin Bach L on: 04/16/2014 12:01 PM   Modules accepted: Orders

## 2014-04-16 NOTE — Patient Instructions (Signed)
[facility] [phone] office [email or web address] [address] [place], [place], [place] [address]  Rema Fendt, therapist  Toy Cookey, MA, clinical psychologist  Cognitive-Behavior Therapy; Mood Disorders; Anxiety Disorders; adult and child ADHD; Family Therapy; Stress Management; personal growth, and Marital Therapy.    Terrance Mass Ph.D., clinical psychologist Cognitive-Behavior Therapy; Mood Disorders; Anxiety Disorders; Stress     Management   [facility] [address], [place], [place] [address] [phone]   [facility], psychotherapist [address] [place], [place] [address] [phone]   Karin Golden Ph.D., clinical psychologist [phone] office [place], [place] [address] Cognitive Behavior Therapy, Depression, Bipolar, Anxiety, Grief and Loss

## 2014-04-16 NOTE — Progress Notes (Signed)
Subjective:   HPI  Jennifer Leonard is a 43 y.o. female who presents for a complete physical.  Preventative care: Last ophthalmology visit:YES DIGBIBY Last dental visit:YES- Jennifer Leonard Last colonoscopy:N/A Last mammogram:11/2013 Last gynecological exam:11/2013 Jennifer Leonard Last EKG: Last labs:?  Prior vaccinations: TD or Tdap:? Influenza: Pneumococcal:N/A Shingles/Zostavax:N/A  Concerns: Stress - her female spouse in on dialysis, not doing well, her daughter 70yo doesn't take of her self or her 9 yo son, so Jennifer Leonard is basically taking care of 4 people while holding down her own job.   Reviewed their medical, surgical, family, social, medication, and allergy history and updated chart as appropriate.  Past Medical History  Diagnosis Date  . Anxiety   . Wears glasses     astigmatism  . Routine gynecological examination     Dr. Hazle Leonard  . History of mammogram 11/2013    Past Surgical History  Procedure Laterality Date  . Cholecystectomy    . Wisdom tooth extraction      History   Social History  . Marital Status: Divorced    Spouse Name: N/A    Number of Children: N/A  . Years of Education: N/A   Occupational History  . Not on file.   Social History Main Topics  . Smoking status: Never Smoker   . Smokeless tobacco: Not on file  . Alcohol Use: Yes     Comment: socially  . Drug Use: No  . Sexual Activity: Not on file   Other Topics Concern  . Not on file   Social History Narrative   Married, daughter and grandson live with her, exercise- very little as of 4/ 2015.  Has a bicycle.   Works in Engineer, technical sales at [employer] History  Problem Relation Age of Onset  . Diabetes Mother   . Hypertension Mother   . Hypertension Father   . Hyperlipidemia Father   . Seizures Sister   . Cancer Maternal Aunt     breast  . Heart disease Maternal Grandmother   . Cancer Paternal Grandmother     breast  . Cancer Paternal Grandfather     lung  . Stroke Neg  Hx     Current outpatient prescriptions:ALPRAZolam (XANAX) 0.5 MG tablet, Take 0.25 mg by mouth 4 (four) times daily., Disp: , Rfl: ;  ibuprofen (ADVIL,MOTRIN) 200 MG tablet, Take 400 mg by mouth every 6 (six) hours as needed. pain, Disp: , Rfl: ;  ranitidine (ZANTAC) 150 MG tablet, Take 150 mg by mouth 2 (two) times daily., Disp: , Rfl:   Allergies  Allergen Reactions  . Sulfonamide Derivatives Rash    Review of Systems Constitutional: -fever, -chills, -sweats, -unexpected weight change, -decreased appetite, -fatigue Allergy: -sneezing, -itching, -congestion Dermatology: -changing moles, --rash, -lumps ENT: -runny nose, -ear pain, -sore throat, -hoarseness, -sinus pain, -teeth pain, - ringing in ears, -hearing loss, -nosebleeds Cardiology: -chest pain, -palpitations, -swelling, -difficulty breathing when lying flat, -waking up short of breath Respiratory: -cough, -shortness of breath, -difficulty breathing with exercise or exertion, -wheezing, -coughing up blood Gastroenterology: -abdominal pain, -nausea, -vomiting, -diarrhea, -constipation, -blood in stool, -changes in bowel movement, -difficulty swallowing or eating Hematology: -bleeding, -bruising  Musculoskeletal: -joint aches, -muscle aches, -joint swelling, +back pain, -neck pain, -cramping, -changes in gait Ophthalmology: denies vision changes, eye redness, itching, discharge Urology: -burning with urination, -difficulty urinating, -blood in urine, -urinary frequency, -urgency, -incontinence Neurology: -headache, -weakness, -tingling, -numbness, -memory loss, -falls, -dizziness Psychology: -depressed mood, -agitation, -sleep problems  Objective:   Physical Exam  BP 142/90  Pulse 62  Temp(Src) 98.4 F (36.9 C) (Oral)  Resp 14  Ht 5\' 4"  (1.626 m)  Wt 206 lb (93.441 kg)  BMI 35.34 kg/m2  General appearance: alert, no distress, WD/WN, obese white female Skin: Scattered macules on the back and chest, small skin tag on the  right upper eyelid, otherwise no worrisome lesion HEENT: normocephalic, conjunctiva/corneas normal, sclerae anicteric, PERRLA, EOMi, nares patent, no discharge or erythema, pharynx normal Oral cavity: MMM, tongue normal, teeth in good repair Neck: supple, no lymphadenopathy, no thyromegaly, no masses, normal ROM, no bruits Chest: non tender, normal shape and expansion Heart: RRR, normal S1, S2, no murmurs Lungs: CTA bilaterally, no wheezes, rhonchi, or rales Abdomen: +bs, soft, RUQ surgical scars, non tender, non distended, no masses, no hepatomegaly, no splenomegaly, no bruits Back: non tender, normal ROM, no scoliosis Musculoskeletal: upper extremities non tender, no obvious deformity, normal ROM throughout, lower extremities non tender, no obvious deformity, normal ROM throughout Extremities: no edema, no cyanosis, no clubbing Pulses: 2+ symmetric, upper and lower extremities, normal cap refill Neurological: alert, oriented x 3, CN2-12 intact, strength normal upper extremities and lower extremities, sensation normal throughout, DTRs 2+ throughout, no cerebellar signs, gait normal Psychiatric: normal affect, behavior normal, pleasant  Breast/gyn/rectal - deferred to gyn   Assessment and Plan :    Encounter Diagnoses  Name Primary?  . Routine general medical examination at a health care facility Yes  . Obesity, unspecified   . Elevated blood-pressure reading without diagnosis of hypertension   . Anxiety state, unspecified   . Need for Tdap vaccination     Physical exam - discussed healthy lifestyle, diet, exercise, preventative care, vaccinations, and addressed their concerns.  Handout given. Discussed her blood pressure, weight, advise she work on weight loss through diet and exercise means.  We can consider medication pending labs. Anxiety-discussed her concerns and situation.  Handout given on counselors in the area. Counseled on the Tdap (tetanus, diptheria, and acellular  pertussis) vaccine.  Vaccine information sheet given. Tdap vaccine given after consent obtained. Follow-up pending labs.

## 2014-04-17 LAB — COMPREHENSIVE METABOLIC PANEL
ALT: 19 U/L (ref 0–35)
AST: 17 U/L (ref 0–37)
Albumin: 4.4 g/dL (ref 3.5–5.2)
Alkaline Phosphatase: 111 U/L (ref 39–117)
BUN: 7 mg/dL (ref 6–23)
CO2: 23 mEq/L (ref 19–32)
Calcium: 9 mg/dL (ref 8.4–10.5)
Chloride: 104 mEq/L (ref 96–112)
Creat: 0.78 mg/dL (ref 0.50–1.10)
Glucose, Bld: 90 mg/dL (ref 70–99)
Potassium: 3.7 mEq/L (ref 3.5–5.3)
Sodium: 137 mEq/L (ref 135–145)
Total Bilirubin: 0.5 mg/dL (ref 0.2–1.2)
Total Protein: 7.2 g/dL (ref 6.0–8.3)

## 2014-04-17 LAB — HEMOGLOBIN A1C
Hgb A1c MFr Bld: 5.7 % — ABNORMAL HIGH (ref <5.7)
Mean Plasma Glucose: 117 mg/dL — ABNORMAL HIGH (ref <117)

## 2014-04-17 LAB — LIPID PANEL
Cholesterol: 189 mg/dL (ref 0–200)
HDL: 49 mg/dL (ref >39)
LDL Cholesterol: 113 mg/dL — ABNORMAL HIGH (ref 0–99)
Total CHOL/HDL Ratio: 3.9 Ratio
Triglycerides: 134 mg/dL (ref <150)
VLDL: 27 mg/dL (ref 0–40)

## 2014-04-17 LAB — TSH: TSH: 1.072 u[IU]/mL (ref 0.350–4.500)

## 2014-12-17 ENCOUNTER — Other Ambulatory Visit: Payer: Self-pay | Admitting: Internal Medicine

## 2014-12-17 ENCOUNTER — Other Ambulatory Visit: Payer: Self-pay | Admitting: *Deleted

## 2014-12-17 ENCOUNTER — Other Ambulatory Visit: Payer: 59

## 2014-12-17 DIAGNOSIS — Z0000 Encounter for general adult medical examination without abnormal findings: Secondary | ICD-10-CM

## 2014-12-18 LAB — CLOSTRIDIUM DIFFICILE BY PCR: Toxigenic C. Difficile by PCR: NOT DETECTED

## 2014-12-18 LAB — HEPATITIS A ANTIBODY, TOTAL: HEP A TOTAL AB: REACTIVE — AB

## 2014-12-18 LAB — HEPATITIS C ANTIBODY: HCV AB: NEGATIVE

## 2014-12-18 LAB — RPR

## 2014-12-18 LAB — HIV ANTIBODY (ROUTINE TESTING W REFLEX): HIV 1&2 Ab, 4th Generation: NONREACTIVE

## 2014-12-18 LAB — HEPATITIS B SURFACE ANTIBODY,QUALITATIVE: Hep B S Ab: NEGATIVE

## 2014-12-18 LAB — HEPATITIS B SURFACE ANTIGEN: HEP B S AG: NEGATIVE

## 2014-12-18 LAB — HEPATITIS A ANTIBODY, IGM: Hep A IgM: NONREACTIVE

## 2014-12-21 LAB — STOOL CULTURE

## 2015-02-20 ENCOUNTER — Other Ambulatory Visit: Payer: Self-pay | Admitting: Obstetrics and Gynecology

## 2015-02-21 LAB — CYTOLOGY - PAP

## 2015-07-10 ENCOUNTER — Encounter: Payer: Self-pay | Admitting: Medical

## 2015-07-10 ENCOUNTER — Telehealth: Payer: Self-pay | Admitting: Medical

## 2015-07-10 ENCOUNTER — Ambulatory Visit (INDEPENDENT_AMBULATORY_CARE_PROVIDER_SITE_OTHER): Payer: 59 | Admitting: Medical

## 2015-07-10 VITALS — BP 110/80 | HR 69 | Temp 97.6°F | Resp 15 | Ht 64.0 in | Wt 228.0 lb

## 2015-07-10 DIAGNOSIS — M722 Plantar fascial fibromatosis: Secondary | ICD-10-CM | POA: Diagnosis not present

## 2015-07-10 DIAGNOSIS — M79671 Pain in right foot: Secondary | ICD-10-CM | POA: Diagnosis not present

## 2015-07-10 DIAGNOSIS — Z0000 Encounter for general adult medical examination without abnormal findings: Secondary | ICD-10-CM | POA: Diagnosis not present

## 2015-07-10 DIAGNOSIS — E669 Obesity, unspecified: Secondary | ICD-10-CM | POA: Insufficient documentation

## 2015-07-10 DIAGNOSIS — L989 Disorder of the skin and subcutaneous tissue, unspecified: Secondary | ICD-10-CM | POA: Insufficient documentation

## 2015-07-10 LAB — COMPREHENSIVE METABOLIC PANEL
ALBUMIN: 3.9 g/dL (ref 3.5–5.2)
ALT: 18 U/L (ref 0–35)
AST: 17 U/L (ref 0–37)
Alkaline Phosphatase: 94 U/L (ref 39–117)
BUN: 7 mg/dL (ref 6–23)
CALCIUM: 9 mg/dL (ref 8.4–10.5)
CO2: 26 mEq/L (ref 19–32)
Chloride: 104 mEq/L (ref 96–112)
Creat: 0.74 mg/dL (ref 0.50–1.10)
GLUCOSE: 89 mg/dL (ref 70–99)
Potassium: 4 mEq/L (ref 3.5–5.3)
Sodium: 139 mEq/L (ref 135–145)
Total Bilirubin: 0.6 mg/dL (ref 0.2–1.2)
Total Protein: 6.9 g/dL (ref 6.0–8.3)

## 2015-07-10 LAB — POCT URINALYSIS DIPSTICK
Bilirubin, UA: NEGATIVE
Blood, UA: NEGATIVE
Glucose, UA: NEGATIVE
KETONES UA: NEGATIVE
Nitrite, UA: NEGATIVE
PROTEIN UA: NEGATIVE
Spec Grav, UA: 1.01
UROBILINOGEN UA: NEGATIVE
pH, UA: 6

## 2015-07-10 LAB — CBC
HEMATOCRIT: 38.2 % (ref 36.0–46.0)
Hemoglobin: 13 g/dL (ref 12.0–15.0)
MCH: 28.2 pg (ref 26.0–34.0)
MCHC: 34 g/dL (ref 30.0–36.0)
MCV: 82.9 fL (ref 78.0–100.0)
MPV: 11 fL (ref 8.6–12.4)
PLATELETS: 251 10*3/uL (ref 150–400)
RBC: 4.61 MIL/uL (ref 3.87–5.11)
RDW: 13.8 % (ref 11.5–15.5)
WBC: 6.8 10*3/uL (ref 4.0–10.5)

## 2015-07-10 NOTE — Telephone Encounter (Signed)
Refer to dermatology - general skin surveillance and skin concerns, off [facility] in [place], not sure of office name, but she has been there prior, soon as possible before her insurance changes.

## 2015-07-10 NOTE — Progress Notes (Signed)
Subjective:   HPI  Jennifer Leonard is a 43 y.o. female who presents for a complete physical.  Medical care team includes:  Dr. Jerline Pain, ophthalmology  Dr. Marian Sorrow, dentist  Dr. Helane Rima, gynecology Glade Lloyd, Copelan M[place]sby Audelia Acton, PA-C  Here for primary care   Preventative care: Last ophthalmology visit: yes Dr. Jerline Pain seen 12/2014 Last dental visit: yes Dr. Marian Sorrow Last colonoscopy:n/a Last mammogram:1/ 2016 Last gynecological exam:12/2014 dr. Helane Rima Last EKG:03/2012 Last labs:?  Prior vaccinations: TD or Tdap:2015 Influenza:? Pneumococcal:?  Concerns: Right foot pain and lump x 3 wk, no injury, trauma   Reviewed their medical, surgical, family, social, medication, and allergy history and updated chart as appropriate.  Past Medical History  Diagnosis Date  . Anxiety   . Wears glasses     astigmatism  . Routine gynecological examination     Dr. Helane Rima  . History of mammogram 11/2013    Past Surgical History  Procedure Laterality Date  . Cholecystectomy    . Wisdom tooth extraction      History   Social History  . Marital Status: Divorced    Spouse Name: N/A  . Number of Children: N/A  . Years of Education: N/A   Occupational History  . Not on file.   Social History Main Topics  . Smoking status: Never Smoker   . Smokeless tobacco: Not on file  . Alcohol Use: Yes     Comment: socially  . Drug Use: No  . Sexual Activity: Not on file   Other Topics Concern  . Not on file   Social History Narrative   Married, daughter and grandson live with her, exercise- very little as of 06/2015.  Has a bicycle.   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 Diabetes Mother   . Hypertension Mother   . Depression Mother   . Anxiety disorder Mother   . Hypertension Father   . Hyperlipidemia Father   . Depression Father   . Seizures Sister   . Depression Sister   . Cancer Maternal Aunt     breast  . Heart disease Maternal Grandmother      valve disease  . Cancer Paternal Grandmother     breast  . Cancer Paternal Grandfather     lung  . Stroke Neg Hx      Current outpatient prescriptions:  .  ibuprofen (ADVIL,MOTRIN) 200 MG tablet, Take 400 mg by mouth every 6 (six) hours as needed. pain, Disp: , Rfl:  .  ALPRAZolam (XANAX) 0.5 MG tablet, Take 0.25 mg by mouth 4 (four) times daily., Disp: , Rfl:  .  ranitidine (ZANTAC) 150 MG tablet, Take 150 mg by mouth 2 (two) times daily., Disp: , Rfl:   Allergies  Allergen Reactions  . Sulfonamide Derivatives Rash    Review of Systems Constitutional: -fever, -chills, -sweats, -unexpected weight change, -decreased appetite, -fatigue Allergy: -sneezing, -itching, -congestion Dermatology: -changing moles, --rash, -lumps ENT: -runny nose, -ear pain, -sore throat, -hoarseness, -sinus pain, -teeth pain, - ringing in ears, -hearing loss, -nosebleeds Cardiology: -chest pain, -palpitations, -swelling, -difficulty breathing when lying flat, -waking up short of breath Respiratory: -cough, -shortness of breath, -difficulty breathing with exercise or exertion, -wheezing, -coughing up blood Gastroenterology: -abdominal pain, -nausea, -vomiting, -diarrhea, -constipation, -blood in stool, -changes in bowel movement, -difficulty swallowing or eating Hematology: -bleeding, -bruising  Musculoskeletal: -joint aches, -muscle aches, -joint swelling, -back pain, -neck pain, -cramping, -changes in gait Ophthalmology: denies vision  changes, eye redness, itching, discharge Urology: -burning with urination, -difficulty urinating, -blood in urine, -urinary frequency, -urgency, -incontinence Neurology: -headache, -weakness, -tingling, -numbness, -memory loss, -falls, -dizziness Psychology: -depressed mood, -agitation, -sleep problems     Objective:   Physical Exam  BP 110/80 mmHg  Pulse 69  Temp(Src) 97.6 F (36.4 C) (Oral)  Resp 15  Ht 5\' 4"  (1.626 m)  Wt 228 lb (103.42 kg)  BMI 39.12  kg/m2  General appearance: alert, no distress, WD/WN, obese white female Skin: Scattered macules on the back and chest, small skin tag on the right upper eyelid, otherwise no worrisome lesion HEENT: normocephalic, conjunctiva/corneas normal, sclerae anicteric, PERRLA, EOMi, nares patent, no discharge or erythema, pharynx normal Oral cavity: MMM, tongue normal, teeth in good repair Neck: supple, no lymphadenopathy, no thyromegaly, no masses, normal ROM, no bruits Chest: non tender, normal shape and expansion Heart: RRR, normal S1, S2, no murmurs Lungs: CTA bilaterally, no wheezes, rhonchi, or rales Abdomen: +bs, soft, RUQ port surgical scars, non tender, non distended, no masses, no hepatomegaly, no splenomegaly, no bruits Back: non tender, normal ROM, no scoliosis Musculoskeletal: right foot along medial surface of 1st metatarsal distally with somewhat tender 2cm x 1cm lump or fullness, no distinct mobile mass, some tenderness along sole of right foot plantar fascia in general, otherwise upper extremities non tender, no obvious deformity, normal ROM throughout, lower extremities non tender, no obvious deformity, normal ROM throughout Extremities: no edema, no cyanosis, no clubbing Pulses: 2+ symmetric, upper and lower extremities, normal cap refill Neurological: alert, oriented x 3, CN2-12 intact, strength normal upper extremities and lower extremities, sensation normal throughout, DTRs 2+ throughout, no cerebellar signs, gait normal Psychiatric: normal affect, behavior normal, pleasant  Breast/gyn/rectal - deferred to gyn   Assessment and Plan :    Encounter Diagnoses  Name Primary?  . Encounter for health maintenance examination in adult Yes  . Foot pain, right   . Plantar fasciitis of right foot   . Obesity   . Skin lesions     Physical exam - discussed healthy lifestyle, diet, exercise, preventative care, vaccinations, and addressed their concerns.  Handout given. See your eye  doctor yearly for routine vision care. See your dentist yearly for routine dental care including hygiene visits twice yearly. See your gynecologist yearly for routine gynecological care. Foot pain - there is a tender lump, could be neuroma vs inflamed plantar fascia vs other.   She will use tennis ball massage, water bottle massage, and if not improving in the next 2-3 wk, then will refer to podiatry Plantar fascitis - advised supportive measures, stretching, tennis ball massage, consider OTC 90 degree splints, consider podiatry referral if not improving over the next 3-4 wk with recommendations today Obesity - work on lifestyle changes, needs to start exercising Skin lesions - referral to dermatology for surveillance Follow-up pending labs

## 2015-07-10 NOTE — Addendum Note (Signed)
Addended by: Armanda Magic on: 07/10/2015 03:24 PM   Modules accepted: Orders

## 2015-07-10 NOTE — Telephone Encounter (Signed)
Additionally, she may want to make a tentative podiatry appt if the foot lump doesn't resolve.   We can refer to podiatry and get her on the schedule, but she should cancel well in advance if the issue resolves

## 2015-07-11 LAB — HEMOGLOBIN A1C
Hgb A1c MFr Bld: 5.8 % — ABNORMAL HIGH (ref <5.7)
Mean Plasma Glucose: 120 mg/dL — ABNORMAL HIGH (ref <117)

## 2015-07-15 NOTE — Telephone Encounter (Signed)
[facility]  [facility]   Address: [address] [place], [place] [address]  [phone]

## 2015-07-15 NOTE — Telephone Encounter (Signed)
APPOINTMENT IS 08/29/15 @ 1140  I MAILED THE PATIENT HER APPOINTMENT INFORMATION

## 2015-08-22 ENCOUNTER — Encounter: Payer: Self-pay | Admitting: Gastroenterology

## 2015-09-25 ENCOUNTER — Encounter: Payer: Self-pay | Admitting: Medical

## 2016-03-05 ENCOUNTER — Encounter: Payer: Self-pay | Admitting: Medical

## 2016-03-05 ENCOUNTER — Ambulatory Visit (INDEPENDENT_AMBULATORY_CARE_PROVIDER_SITE_OTHER): Payer: 59 | Admitting: Medical

## 2016-03-05 VITALS — BP 140/98 | HR 94 | Temp 98.9°F | Resp 16 | Wt 234.0 lb

## 2016-03-05 DIAGNOSIS — R509 Fever, unspecified: Secondary | ICD-10-CM | POA: Diagnosis not present

## 2016-03-05 DIAGNOSIS — R5381 Other malaise: Secondary | ICD-10-CM

## 2016-03-05 DIAGNOSIS — R059 Cough, unspecified: Secondary | ICD-10-CM

## 2016-03-05 DIAGNOSIS — R05 Cough: Secondary | ICD-10-CM | POA: Diagnosis not present

## 2016-03-05 LAB — POC INFLUENZA A&B (BINAX/QUICKVUE)
Influenza A, POC: NEGATIVE
Influenza B, POC: NEGATIVE

## 2016-03-05 NOTE — Progress Notes (Signed)
  Subjective:  Jennifer Leonard is a 45 y.o. female who presents for illness.  Started 2.5 days ago with cough, chills, some aches, run down feeling, headache, sore throat, but no NVD, no rash, no congestion or sneezing, no ear pain.   Has lots of cough.  Has + sick contacts.   Using emergent C.  No other aggravating or relieving factors.  No other c/o.  Past Medical History  Diagnosis Date  . Anxiety   . Wears glasses     astigmatism  . Routine gynecological examination     Dr. Helane Rima  . History of mammogram 11/2013   ROS as in subjective   Objective: BP 140/98 mmHg  Pulse 94  Temp(Src) 98.9 F (37.2 C) (Tympanic)  Resp 16  Wt 234 lb (106.142 kg)  LMP 02/20/2016  General appearance: alert, no distress, WD/WN, mildly ill appearing HEENT: normocephalic, sclerae anicteric, conjunctiva pink and moist, TMs pearly, nares patent, no discharge or erythema, pharynx normal, tonsils unremarkable Oral cavity: MMM, no lesions Neck: supple, no lymphadenopathy, no thyromegaly, no masses Heart: RRR, normal S1, S2, no murmurs Lungs: CTA bilaterally, no wheezes, rhonchi, or rales Pulses: 2+ symmetric       Assessment  Encounter Diagnoses  Name Primary?  . Chills with fever Yes  . Cough   . Malaise       Plan: Flu swab negative.  Symptoms and exam suggest viral URI.  discussed supportive care, rest, hydration, can use OTC Robitussin DM or similar, and f/u if worse or not improving.

## 2016-07-13 ENCOUNTER — Ambulatory Visit (INDEPENDENT_AMBULATORY_CARE_PROVIDER_SITE_OTHER): Payer: BLUE CROSS/BLUE SHIELD | Admitting: Medical

## 2016-07-13 ENCOUNTER — Encounter: Payer: Self-pay | Admitting: Medical

## 2016-07-13 VITALS — BP 138/96 | HR 66 | Ht 64.0 in | Wt 225.0 lb

## 2016-07-13 DIAGNOSIS — I10 Essential (primary) hypertension: Secondary | ICD-10-CM | POA: Diagnosis not present

## 2016-07-13 DIAGNOSIS — E669 Obesity, unspecified: Secondary | ICD-10-CM

## 2016-07-13 DIAGNOSIS — R7301 Impaired fasting glucose: Secondary | ICD-10-CM | POA: Insufficient documentation

## 2016-07-13 DIAGNOSIS — Z0000 Encounter for general adult medical examination without abnormal findings: Secondary | ICD-10-CM | POA: Diagnosis not present

## 2016-07-13 LAB — POCT URINALYSIS DIPSTICK
Bilirubin, UA: NEGATIVE
Blood, UA: NEGATIVE
Glucose, UA: NEGATIVE
KETONES UA: NEGATIVE
LEUKOCYTES UA: NEGATIVE
NITRITE UA: NEGATIVE
PH UA: 6
PROTEIN UA: NEGATIVE
Spec Grav, UA: 1.025
Urobilinogen, UA: NEGATIVE

## 2016-07-13 NOTE — Progress Notes (Signed)
Subjective:   HPI  Jennifer Leonard is a 45 y.o. female who presents for a complete physical.  Medical care team includes:  Dr. Jerline Pain, ophthalmology  Dr. Marian Sorrow, dentist  Dr. 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  Thinking of doing some Optometrist, Biomedical scientist as well.   Other stressors include taking care of her spouse with lots of medical problems and still raising her grandson.   Has used paxil in the past.   Thinks she could benefit from being back on medication.    Saw gynecology in May, and was started on BP medication.   Both parents have HTN.   Checks BPs some.  The day her gynecologist put her on medication BP was 160/110.  Was started on HCTZ 50mg  daily.   Been going to weight watchers, trying to lose weight.  Had lost 14lb, but has gained some back.  Doesn't add salt to food, but no other discretion.  No chest pain, no dyspnea, no leg edema.    Reviewed their medical, surgical, family, social, medication, and allergy history and updated chart as appropriate.  Past Medical History  Diagnosis Date  . Anxiety   . Wears glasses     astigmatism  . Routine gynecological examination     Dr. Helane Rima  . History of mammogram 11/2013    Past Surgical History  Procedure Laterality Date  . Cholecystectomy    . Wisdom tooth extraction      Social History   Social History  . Marital Status: Divorced    Spouse Name: N/A  . Number of Children: N/A  . Years of Education: N/A   Occupational History  . Not on file.   Social History Main Topics  . Smoking status: Never Smoker   . Smokeless tobacco: Not on file  . Alcohol Use: Yes     Comment: socially  . Drug Use: No  . Sexual Activity: Not on file   Other Topics Concern  . Not on file   Social History Narrative   Married to female partner, daughter and grandson live with her, exercise- not much.  Has a  bicycle.   Works in Engineer, technical sales at [employer]. As of 06/2016    Family History  Problem Relation Age of Onset  . Diabetes Mother   . Hypertension Mother   . Depression Mother   . Anxiety disorder Mother   . Hypertension Father   . Hyperlipidemia Father   . Depression Father   . Seizures Sister   . Depression Sister   . Cancer Maternal Aunt     breast  . Heart disease Maternal Grandmother     valve disease  . Cancer Paternal Grandmother     breast  . Cancer Paternal Grandfather     lung  . Stroke Neg Hx      Current outpatient prescriptions:  .  ibuprofen (ADVIL,MOTRIN) 200 MG tablet, Take 400 mg by mouth every 6 (six) hours as needed. Reported on 07/13/2016, Disp: , Rfl:  .  ranitidine (ZANTAC) 150 MG tablet, Take 150 mg by mouth 2 (two) times daily. Reported on 07/13/2016, Disp: , Rfl:   Allergies  Allergen Reactions  . Sulfonamide Derivatives Rash    Review of Systems Constitutional: -fever, -chills, -sweats, -unexpected weight change, -decreased appetite, -fatigue Allergy: -sneezing, -itching, -congestion Dermatology: -changing moles, --rash, -lumps ENT: -runny nose, -ear pain, -sore  throat, -hoarseness, -sinus pain, -teeth pain, - ringing in ears, -hearing loss, -nosebleeds Cardiology: -chest pain, -palpitations, -swelling, -difficulty breathing when lying flat, -waking up short of breath Respiratory: -cough, -shortness of breath, -difficulty breathing with exercise or exertion, -wheezing, -coughing up blood Gastroenterology: -abdominal pain, -nausea, -vomiting, -diarrhea, -constipation, -blood in stool, -changes in bowel movement, -difficulty swallowing or eating Hematology: -bleeding, -bruising  Musculoskeletal: -joint aches, -muscle aches, -joint swelling, -back pain, -neck pain, -cramping, -changes in gait Ophthalmology: denies vision changes, eye redness, itching, discharge Urology: -burning with urination, -difficulty urinating, -blood in urine, -urinary frequency,  -urgency, -incontinence Neurology: -headache, -weakness, -tingling, -numbness, -memory loss, -falls, -dizziness Psychology: -depressed mood, -agitation, -sleep problems     Objective:   Physical Exam  BP 138/96 mmHg  Pulse 66  Ht 5\' 4"  (1.626 m)  Wt 225 lb (102.059 kg)  BMI 38.60 kg/m2  LMP 07/09/2016  Wt Readings from Last 3 Encounters:  07/13/16 225 lb (102.059 kg)  03/05/16 234 lb (106.142 kg)  07/10/15 228 lb (103.42 kg)   BP Readings from Last 3 Encounters:  07/13/16 138/96  03/05/16 140/98  07/10/15 110/80    General appearance: alert, no distress, WD/WN, obese white female Skin: Scattered macules on the back and chest, small skin tag on the right upper eyelid, otherwise no worrisome lesion HEENT: normocephalic, conjunctiva/corneas normal, sclerae anicteric, PERRLA, EOMi, nares patent, no discharge or erythema, pharynx normal Oral cavity: MMM, tongue normal, teeth in good repair Neck: supple, no lymphadenopathy, no thyromegaly, no masses, normal ROM, no bruits Chest: non tender, normal shape and expansion Heart: RRR, normal S1, S2, no murmurs Lungs: CTA bilaterally, no wheezes, rhonchi, or rales Abdomen: +bs, soft, RUQ port surgical scars, non tender, non distended, no masses, no hepatomegaly, no splenomegaly, no bruits Back: non tender, normal ROM, no scoliosis Musculoskeletal: upper extremities non tender, no obvious deformity, normal ROM throughout, lower extremities non tender, no obvious deformity, normal ROM throughout Extremities: no edema, no cyanosis, no clubbing Pulses: 2+ symmetric, upper and lower extremities, normal cap refill Neurological: alert, oriented x 3, CN2-12 intact, strength normal upper extremities and lower extremities, sensation normal throughout, DTRs 2+ throughout, no cerebellar signs, gait normal Psychiatric: normal affect, behavior normal, pleasant  Breast/gyn/rectal - deferred to gyn   Adult ECG Report  Indication: hypertension,  physical  Rate: 56 bpm  Rhythm: sinus bradycardia  QRS Axis: 35 degrees  PR Interval: 182ms  QRS Duration: 108 ms   QTc: 434ms  Conduction Disturbances: isolated Q in III  Other Abnormalities: questionable prolonged QT  Patient's cardiac risk factors are: hypertension and obesity (BMI >= 30 kg/m2).  EKG comparison: 2013  Narrative Interpretation: sinus bradycardia, otherwise unremarkable   Assessment and Plan :    Encounter Diagnoses  Name Primary?  . Encounter for health maintenance examination in adult Yes  . Obesity   . Impaired fasting glucose   . Essential hypertension    Physical exam - discussed healthy lifestyle, diet, exercise, preventative care, vaccinations, and addressed their concerns.  Handout given. See your eye doctor yearly for routine vision care. See your dentist yearly for routine dental care including hygiene visits twice yearly. See your gynecologist yearly for routine gynecological care. See dermatology periodically Get flu shot yearly Hypertension - stop HCTZ 50mg  daily due to the lack of benefit at that high of dose and risk of hypokalemia.   Pending labs, consider change in therapy.  EKG reviewed.  Impaired fasting glucose - labs today Obesity - work on lifestyle changes,  glad to hear she has had some success, c/t exercise, c/t Weight Watchers and review info on DASH and Mediterranean diets.  Set goals for weight loss over the next few months.  Up to date on pap, mammogram per gyn Follow-up pending labs

## 2016-07-13 NOTE — Addendum Note (Signed)
Addended by: Billie Lade on: 07/13/2016 11:03 AM   Modules accepted: Orders, SmartSet

## 2016-07-13 NOTE — Patient Instructions (Signed)
Why follow it? Research shows. . Those who follow the Mediterranean diet have a reduced risk of heart disease  . The diet is associated with a reduced incidence of Parkinson's and Alzheimer's diseases . People following the diet may have longer life expectancies and lower rates of chronic diseases  . The Dietary Guidelines for Americans recommends the Mediterranean diet as an eating plan to promote health and prevent disease  What Is the Mediterranean Diet?  . Healthy eating plan based on typical foods and recipes of Mediterranean-style cooking . The diet is primarily a plant based diet; these foods should make up a majority of meals   Starches - Plant based foods should make up a majority of meals - They are an important sources of vitamins, minerals, energy, antioxidants, and fiber - Choose whole grains, foods high in fiber and minimally processed items  - Typical grain sources include wheat, oats, barley, corn, brown rice, bulgar, farro, millet, polenta, couscous  - Various types of beans include chickpeas, lentils, fava beans, black beans, white beans   Fruits  Veggies - Large quantities of antioxidant rich fruits & veggies; 6 or more servings  - Vegetables can be eaten raw or lightly drizzled with oil and cooked  - Vegetables common to the traditional Mediterranean Diet include: artichokes, arugula, beets, broccoli, brussel sprouts, cabbage, carrots, celery, collard greens, cucumbers, eggplant, kale, leeks, lemons, lettuce, mushrooms, okra, onions, peas, peppers, potatoes, pumpkin, radishes, rutabaga, shallots, spinach, sweet potatoes, turnips, zucchini - Fruits common to the Mediterranean Diet include: apples, apricots, avocados, cherries, clementines, dates, figs, grapefruits, grapes, melons, nectarines, oranges, peaches, pears, pomegranates, strawberries, tangerines  Fats - Replace butter and margarine with healthy oils, such as olive oil, canola oil, and tahini  - Limit nuts to no  more than a handful a day  - Nuts include walnuts, almonds, pecans, pistachios, pine nuts  - Limit or avoid candied, honey roasted or heavily salted nuts - Olives are central to the Mediterranean diet - can be eaten whole or used in a variety of dishes   Meats Protein - Limiting red meat: no more than a few times a month - When eating red meat: choose lean cuts and keep the portion to the size of deck of cards - Eggs: approx. 0 to 4 times a week  - Fish and lean poultry: at least 2 a week  - Healthy protein sources include, chicken, turkey, lean beef, lamb - Increase intake of seafood such as tuna, salmon, trout, mackerel, shrimp, scallops - Avoid or limit high fat processed meats such as sausage and bacon  Dairy - Include moderate amounts of low fat dairy products  - Focus on healthy dairy such as fat free yogurt, skim milk, low or reduced fat cheese - Limit dairy products higher in fat such as whole or 2% milk, cheese, ice cream  Alcohol - Moderate amounts of red wine is ok  - No more than 5 oz daily for women (all ages) and men older than age 65  - No more than 10 oz of wine daily for men younger than 65  Other - Limit sweets and other desserts  - Use herbs and spices instead of salt to flavor foods  - Herbs and spices common to the traditional Mediterranean Diet include: basil, bay leaves, chives, cloves, cumin, fennel, garlic, lavender, marjoram, mint, oregano, parsley, pepper, rosemary, sage, savory, sumac, tarragon, thyme   It's not just a diet, it's a lifestyle:  . The Mediterranean diet includes   lifestyle factors typical of those in the region  . Foods, drinks and meals are best eaten with others and savored . Daily physical activity is important for overall good health . This could be strenuous exercise like running and aerobics . This could also be more leisurely activities such as walking, housework, yard-work, or taking the stairs . Moderation is the key; a balanced and  healthy diet accommodates most foods and drinks . Consider portion sizes and frequency of consumption of certain foods   Meal Ideas & Options:  . Breakfast:  o Whole wheat toast or whole wheat English muffins with peanut butter & hard boiled egg o Steel cut oats topped with apples & cinnamon and skim milk  o Fresh fruit: banana, strawberries, melon, berries, peaches  o Smoothies: strawberries, bananas, greek yogurt, peanut butter o Low fat greek yogurt with blueberries and granola  o Egg white omelet with spinach and mushrooms o Breakfast couscous: whole wheat couscous, apricots, skim milk, cranberries  . Sandwiches:  o Hummus and grilled vegetables (peppers, zucchini, squash) on whole wheat bread   o Grilled chicken on whole wheat pita with lettuce, tomatoes, cucumbers or tzatziki  o Tuna salad on whole wheat bread: tuna salad made with greek yogurt, olives, red peppers, capers, green onions o Garlic rosemary lamb pita: lamb sauted with garlic, rosemary, salt & pepper; add lettuce, cucumber, greek yogurt to pita - flavor with lemon juice and black pepper  . Seafood:  o Mediterranean grilled salmon, seasoned with garlic, basil, parsley, lemon juice and black pepper o Shrimp, lemon, and spinach whole-grain pasta salad made with low fat greek yogurt  o Seared scallops with lemon orzo  o Seared tuna steaks seasoned salt, pepper, coriander topped with tomato mixture of olives, tomatoes, olive oil, minced garlic, parsley, green onions and cappers  . Meats:  o Herbed greek chicken salad with kalamata olives, cucumber, feta  o Red bell peppers stuffed with spinach, bulgur, lean ground beef (or lentils) & topped with feta   o Kebabs: skewers of chicken, tomatoes, onions, zucchini, squash  o [place] burgers: made with red onions, mint, dill, lemon juice, feta cheese topped with roasted red peppers . Vegetarian o Cucumber salad: cucumbers, artichoke hearts, celery, red onion, feta cheese, tossed in  olive oil & lemon juice  o Hummus and whole grain pita points with a greek salad (lettuce, tomato, feta, olives, cucumbers, red onion) o Lentil soup with celery, carrots made with vegetable broth, garlic, salt and pepper  o Tabouli salad: parsley, bulgur, mint, scallions, cucumbers, tomato, radishes, lemon juice, olive oil, salt and pepper.     DASH Eating Plan DASH stands for "Dietary Approaches to Stop Hypertension." The DASH eating plan is a healthy eating plan that has been shown to reduce high blood pressure (hypertension). Additional health benefits may include reducing the risk of type 2 diabetes mellitus, heart disease, and stroke. The DASH eating plan may also help with weight loss. WHAT DO I NEED TO KNOW ABOUT THE DASH EATING PLAN? For the DASH eating plan, you will follow these general guidelines:  Choose foods with a percent daily value for sodium of less than 5% (as listed on the food label).  Use salt-free seasonings or herbs instead of table salt or sea salt.  Check with your health care provider or pharmacist before using salt substitutes.  Eat lower-sodium products, often labeled as "lower sodium" or "no salt added."  Eat fresh foods.  Eat more vegetables, fruits, and low-fat dairy  products.  Choose whole grains. Look for the word "whole" as the first word in the ingredient list.  Choose fish and skinless chicken or [place] more often than red meat. Limit fish, poultry, and meat to 6 oz (170 g) each day.  Limit sweets, desserts, sugars, and sugary drinks.  Choose heart-healthy fats.  Limit cheese to 1 oz (28 g) per day.  Eat more home-cooked food and less restaurant, buffet, and fast food.  Limit fried foods.  Cook foods using methods other than frying.  Limit canned vegetables. If you do use them, rinse them well to decrease the sodium.  When eating at a restaurant, ask that your food be prepared with less salt, or no salt if possible. WHAT FOODS CAN I  EAT? Seek help from a dietitian for individual calorie needs. Grains Whole grain or whole wheat bread. Brown rice. Whole grain or whole wheat pasta. Quinoa, bulgur, and whole grain cereals. Low-sodium cereals. Corn or whole wheat flour tortillas. Whole grain cornbread. Whole grain crackers. Low-sodium crackers. Vegetables Fresh or frozen vegetables (raw, steamed, roasted, or grilled). Low-sodium or reduced-sodium tomato and vegetable juices. Low-sodium or reduced-sodium tomato sauce and paste. Low-sodium or reduced-sodium canned vegetables.  Fruits All fresh, canned (in natural juice), or frozen fruits. Meat and Other Protein Products Ground beef (85% or leaner), grass-fed beef, or beef trimmed of fat. Skinless chicken or [place]. Ground chicken or [place]. Pork trimmed of fat. All fish and seafood. Eggs. Dried beans, peas, or lentils. Unsalted nuts and seeds. Unsalted canned beans. Dairy Low-fat dairy products, such as skim or 1% milk, 2% or reduced-fat cheeses, low-fat ricotta or cottage cheese, or plain low-fat yogurt. Low-sodium or reduced-sodium cheeses. Fats and Oils Tub margarines without trans fats. Light or reduced-fat mayonnaise and salad dressings (reduced sodium). Avocado. Safflower, olive, or canola oils. Natural peanut or almond butter. Other Unsalted popcorn and pretzels. The items listed above may not be a complete list of recommended foods or beverages. Contact your dietitian for more options. WHAT FOODS ARE NOT RECOMMENDED? Grains White bread. White pasta. White rice. Refined cornbread. Bagels and croissants. Crackers that contain trans fat. Vegetables Creamed or fried vegetables. Vegetables in a cheese sauce. Regular canned vegetables. Regular canned tomato sauce and paste. Regular tomato and vegetable juices. Fruits Dried fruits. Canned fruit in light or heavy syrup. Fruit juice. Meat and Other Protein Products Fatty cuts of meat. Ribs, chicken wings, bacon, sausage,  bologna, salami, chitterlings, fatback, hot dogs, bratwurst, and packaged luncheon meats. Salted nuts and seeds. Canned beans with salt. Dairy Whole or 2% milk, cream, half-and-half, and cream cheese. Whole-fat or sweetened yogurt. Full-fat cheeses or blue cheese. Nondairy creamers and whipped toppings. Processed cheese, cheese spreads, or cheese curds. Condiments Onion and garlic salt, seasoned salt, table salt, and sea salt. Canned and packaged gravies. Worcestershire sauce. Tartar sauce. Barbecue sauce. Teriyaki sauce. Soy sauce, including reduced sodium. Steak sauce. Fish sauce. Oyster sauce. Cocktail sauce. Horseradish. Ketchup and mustard. Meat flavorings and tenderizers. Bouillon cubes. Hot sauce. Tabasco sauce. Marinades. Taco seasonings. Relishes. Fats and Oils Butter, stick margarine, lard, shortening, ghee, and bacon fat. Coconut, palm kernel, or palm oils. Regular salad dressings. Other Pickles and olives. Salted popcorn and pretzels. The items listed above may not be a complete list of foods and beverages to avoid. Contact your dietitian for more information. WHERE CAN I FIND MORE INFORMATION? National Heart, Lung, and Blood Institute: travelstabloid.com   This information is not intended to replace advice given to you by  your health care provider. Make sure you discuss any questions you have with your health care provider.   Document Released: 12/03/2011 Document Revised: 01/04/2015 Document Reviewed: 10/18/2013 Elsevier Interactive Patient Education [employer].

## 2016-07-14 ENCOUNTER — Other Ambulatory Visit: Payer: Self-pay | Admitting: Medical

## 2016-07-14 LAB — LIPID PANEL
CHOLESTEROL: 174 mg/dL (ref 125–200)
HDL: 44 mg/dL — ABNORMAL LOW (ref >=46)
LDL Cholesterol: 89 mg/dL (ref <130)
Total CHOL/HDL Ratio: 4 Ratio (ref <=5.0)
Triglycerides: 203 mg/dL — ABNORMAL HIGH (ref <150)
VLDL: 41 mg/dL — ABNORMAL HIGH (ref <30)

## 2016-07-14 LAB — HEMOGLOBIN A1C
Hgb A1c MFr Bld: 5.9 % — ABNORMAL HIGH (ref <5.7)
Mean Plasma Glucose: 123 mg/dL

## 2016-07-14 LAB — COMPREHENSIVE METABOLIC PANEL
ALBUMIN: 3.9 g/dL (ref 3.6–5.1)
ALT: 18 U/L (ref 6–29)
AST: 17 U/L (ref 10–30)
Alkaline Phosphatase: 108 U/L (ref 33–115)
BUN: 8 mg/dL (ref 7–25)
CALCIUM: 8.8 mg/dL (ref 8.6–10.2)
CHLORIDE: 100 mmol/L (ref 98–110)
CO2: 30 mmol/L (ref 20–31)
CREATININE: 0.76 mg/dL (ref 0.50–1.10)
Glucose, Bld: 105 mg/dL — ABNORMAL HIGH (ref 65–99)
Potassium: 3.2 mmol/L — ABNORMAL LOW (ref 3.5–5.3)
SODIUM: 141 mmol/L (ref 135–146)
Total Bilirubin: 0.4 mg/dL (ref 0.2–1.2)
Total Protein: 6.6 g/dL (ref 6.1–8.1)

## 2016-07-14 LAB — CBC
HCT: 37.8 % (ref 35.0–45.0)
Hemoglobin: 12.3 g/dL (ref 11.7–15.5)
MCH: 28.2 pg (ref 27.0–33.0)
MCHC: 32.5 g/dL (ref 32.0–36.0)
MCV: 86.7 fL (ref 80.0–100.0)
MPV: 10.8 fL (ref 7.5–12.5)
Platelets: 279 10*3/uL (ref 140–400)
RBC: 4.36 MIL/uL (ref 3.80–5.10)
RDW: 13.8 % (ref 11.0–15.0)
WBC: 7.9 10*3/uL (ref 4.0–10.5)

## 2016-07-14 MED ORDER — AMLODIPINE BESYLATE 5 MG PO TABS: 5.0000 mg | ORAL_TABLET | Freq: Every day | ORAL | Status: DC

## 2016-07-14 MED ORDER — POTASSIUM CHLORIDE CRYS ER 10 MEQ PO TBCR: 10.0000 meq | EXTENDED_RELEASE_TABLET | Freq: Every day | ORAL | Status: DC

## 2016-07-14 MED ORDER — HYDROCHLOROTHIAZIDE 25 MG PO TABS: 25.0000 mg | ORAL_TABLET | Freq: Every day | ORAL | Status: DC

## 2016-09-11 ENCOUNTER — Telehealth: Payer: Self-pay

## 2016-09-11 MED ORDER — POTASSIUM CHLORIDE CRYS ER 10 MEQ PO TBCR: 10.0000 meq | EXTENDED_RELEASE_TABLET | Freq: Every day | ORAL | 0 refills | Status: DC

## 2016-09-11 MED ORDER — AMLODIPINE BESYLATE 5 MG PO TABS: 5.0000 mg | ORAL_TABLET | Freq: Every day | ORAL | 1 refills | Status: DC

## 2016-09-11 MED ORDER — HYDROCHLOROTHIAZIDE 25 MG PO TABS: 25.0000 mg | ORAL_TABLET | Freq: Every day | ORAL | 1 refills | Status: DC

## 2016-09-11 NOTE — Telephone Encounter (Signed)
Request medication refills.  sent

## 2016-11-27 DIAGNOSIS — H5213 Myopia, bilateral: Secondary | ICD-10-CM | POA: Diagnosis not present

## 2016-11-27 DIAGNOSIS — H524 Presbyopia: Secondary | ICD-10-CM | POA: Diagnosis not present

## 2016-11-27 DIAGNOSIS — H52223 Regular astigmatism, bilateral: Secondary | ICD-10-CM | POA: Diagnosis not present

## 2017-01-29 ENCOUNTER — Other Ambulatory Visit: Payer: Self-pay | Admitting: Family Medicine

## 2017-01-29 ENCOUNTER — Other Ambulatory Visit: Payer: Self-pay | Admitting: Medical

## 2017-01-29 MED ORDER — OSELTAMIVIR PHOSPHATE 75 MG PO CAPS: 75.0000 mg | ORAL_CAPSULE | Freq: Every day | ORAL | 0 refills | Status: DC

## 2017-01-29 NOTE — Progress Notes (Signed)
   Subjective:    Patient ID: Jennifer Leonard, female    DOB: 1971/05/19, 46 y.o.   MRN: [id number]  HPI    Review of Systems     Objective:   Physical Exam        Assessment & Plan:

## 2017-03-08 ENCOUNTER — Other Ambulatory Visit: Payer: Self-pay | Admitting: Medical

## 2017-11-01 ENCOUNTER — Encounter: Payer: Self-pay | Admitting: Family Medicine

## 2017-11-01 ENCOUNTER — Ambulatory Visit (INDEPENDENT_AMBULATORY_CARE_PROVIDER_SITE_OTHER): Payer: BLUE CROSS/BLUE SHIELD | Admitting: Family Medicine

## 2017-11-01 VITALS — BP 142/100 | HR 72 | Ht 65.0 in | Wt 229.6 lb

## 2017-11-01 DIAGNOSIS — J069 Acute upper respiratory infection, unspecified: Secondary | ICD-10-CM

## 2017-11-01 DIAGNOSIS — R7301 Impaired fasting glucose: Secondary | ICD-10-CM | POA: Diagnosis not present

## 2017-11-01 DIAGNOSIS — I10 Essential (primary) hypertension: Secondary | ICD-10-CM | POA: Diagnosis not present

## 2017-11-01 DIAGNOSIS — Z5181 Encounter for therapeutic drug level monitoring: Secondary | ICD-10-CM | POA: Diagnosis not present

## 2017-11-01 DIAGNOSIS — K219 Gastro-esophageal reflux disease without esophagitis: Secondary | ICD-10-CM | POA: Diagnosis not present

## 2017-11-01 DIAGNOSIS — Z9114 Patient's other noncompliance with medication regimen: Secondary | ICD-10-CM

## 2017-11-01 NOTE — Progress Notes (Signed)
Chief Complaint  Patient presents with  . Hypertension    has not been feeling well. Thought maybe it was heartburn. Has been having high blood pressure readings and some slight chest pains but they have gone.  Did get some zantac. Has had some fatigue. Stopped taking bp meds that were rx'd by Audelia Acton since the spring. Did take bp meds today.   . Flu Vaccine    wants to wait.    She complains of acid reflux, burning in her throat and some pain in her chest for the last week or so.  She has been taking Tums without benefit, so then tried Zantac, pepto bismol.  These helped the burning in her throat, but still had some discomfort in the upper chest.  She noted not feeling well while pushing someone in a wheelchair over the weekend.  That night she saw that her blood pressure was 145/upper 80's.  Yesterday morning it was 147/103. She took HCTZ yesterday morning, and HCTZ and amlodipine today.  She has h/o HTN, and has been off her medications.  She hasn't been seen in this office in well over a year.  She started with head cold and sneezing yesterday morning.  She started taking generic coricidin last night.  Chest pain resolved, but "I just don't feel well". Felt like there was an area of sharp pain to the left of midline, that was sore to touch. This is no longer sore. Heart feels pounding in the mornings the last couple of days.  Sandwiches for lunch (deli meat, not in the last week), some homemade soup. Tries to watch the salt in her diet.  PMH, PSH, SH reviewed  Emergen-C and Ester-C with illlnesses (taking now).  Outpatient Encounter Medications as of 11/01/2017  Medication Sig Note  . amLODipine (NORVASC) 5 MG tablet Take 1 tablet (5 mg total) by mouth daily. 11/01/2017: Restarted today  . Chlorpheniramine-DM (CORICIDIN HBP COUGH/COLD PO) Take 1 tablet every 6 (six) hours by mouth.   . hydrochlorothiazide (HYDRODIURIL) 25 MG tablet Take 1 tablet (25 mg total) by mouth daily. 11/01/2017:  Started taking yesterday  . ranitidine (ZANTAC) 150 MG tablet Take 150 mg by mouth 2 (two) times daily. Reported on 07/13/2016 11/01/2017: Taking prn, just the last few days, once/d  . ibuprofen (ADVIL,MOTRIN) 200 MG tablet Take 400 mg by mouth every 6 (six) hours as needed. Reported on 07/13/2016   . KLOR-CON M10 10 MEQ tablet TAKE 1 TABLET BY MOUTH EVERY DAY (FILLABLE 10-02-16) (Patient not taking: Reported on 11/01/2017)   . [DISCONTINUED] oseltamivir (TAMIFLU) 75 MG capsule Take 1 capsule (75 mg total) by mouth daily.    No facility-administered encounter medications on file as of 11/01/2017.    Allergies  Allergen Reactions  . Sulfonamide Derivatives Rash   ROS: no fever, chills.  +1 day of URI symptoms per HPI. No shortness of breath. Chest pain, now resolved, per HPI.  No nausea, vomiting.  +indigestion/reflux, somewhat improved.  No bowel changes.  No bleeding, bruising, rash.  PHYSICAL EXAM:  BP (!) 150/98 (BP Location: Right Arm, Cuff Size: Large)   Pulse 72   Ht _0  (1.651 m)   Wt 229 lb 9.6 oz (104.1 kg)   BMI 38.21 kg/m   Wt Readings from Last 3 Encounters:  11/01/17 229 lb 9.6 oz (104.1 kg)  07/13/16 225 lb (102.1 kg)  03/05/16 234 lb (106.1 kg)   142/100 on repeat by MD, RA  Well developed, talkative female in no  distress HEENT: PERRL, EOMI, conjunctiva and sclera are clear. OP is clear, no erythema. TM's and EAC's normal.  Nose without drainage, sinuses nontender Neck: no lymphadenopathy, thyromegaly or mass Heart: regular rate and rhythm, no murmur Lungs: clear bilaterally Back: no spinal or CVA tenderness Chest: nontender to palpation (area of prior discomfort was at left costochondral junction, no longer tender) Abdomen: soft, nontender, no organomegaly or mass Extremities: trace edema, normal pulses Psych: normal mood, affect, hygiene and grooming Neuro: alert and oriented, cranial nerves intact, normal strength, gait. Skin: normal turgor, no  rash   ASSESSMENT/PLAN:  Essential hypertension - high BP today, due to noncompliance with BP meds, just restarted today. Reviewed low sodium diet; continue both meds and f/u in 2-3 weeks - Plan: Lipid panel, Comprehensive metabolic panel  Upper respiratory tract infection, unspecified type - no bacterial infection noted; supportive measures reviewed, avoid decongestants due to high BP  Gastroesophageal reflux disease without esophagitis - symptoms somewhat improved with self-treatment. Reviewed diet. Take ranitidine BID vs prilosec/nexium OTC once daily for 2 weeks and discuss at f/u if persists  Impaired fasting glucose - Plan: Hemoglobin A1c  Medication monitoring encounter - Plan: Comprehensive metabolic panel, CBC with Differential/Platelet  Noncompliance with medication regimen   C-met, CBC, lipid, A1c (due for these labs, and happens to be fasting today).  Declines flu shot today, will do when she returns. Encouraged yearly flu shots.    Continue to follow a low sodium diet. Continue your blood pressure medications that you just restarted--it can take up to 2 weeks to see the full effect.  Continue to monitor your blood pressure. Return to see Audelia Acton in 2-3 weeks. Bring your list of blood pressure and include comments.  Increase your ranitidine to 142m twice daily, and take this regularly until your chest discomfort resolves.  If it isn't helping, then change over to either Nexium 24 hr or Prilosec OTC once daily for 2 weeks.

## 2017-11-01 NOTE — Patient Instructions (Signed)
Continue to follow a low sodium diet. Continue your blood pressure medications that you just restarted--it can take up to 2 weeks to see the full effect.  Continue to monitor your blood pressure. Return to see Audelia Acton in 2-3 weeks. Bring your list of blood pressure and include comments.  Increase your ranitidine to 150mg  twice daily, and take this regularly until your chest discomfort resolves.  If it isn't helping, then change over to either Nexium 24 hr or Prilosec OTC once daily for 2 weeks.    Low-Sodium Eating Plan Sodium, which is an element that makes up salt, helps you maintain a healthy balance of fluids in your body. Too much sodium can increase your blood pressure and cause fluid and waste to be held in your body. Your health care provider or dietitian may recommend following this plan if you have high blood pressure (hypertension), kidney disease, liver disease, or heart failure. Eating less sodium can help lower your blood pressure, reduce swelling, and protect your heart, liver, and kidneys. What are tips for following this plan? General guidelines  Most people on this plan should limit their sodium intake to 1,500-2,000 mg (milligrams) of sodium each day. Reading food labels  The Nutrition Facts label lists the amount of sodium in one serving of the food. If you eat more than one serving, you must multiply the listed amount of sodium by the number of servings.  Choose foods with less than 140 mg of sodium per serving.  Avoid foods with 300 mg of sodium or more per serving. Shopping  Look for lower-sodium products, often labeled as "low-sodium" or "no salt added."  Always check the sodium content even if foods are labeled as "unsalted" or "no salt added".  Buy fresh foods. ? Avoid canned foods and premade or frozen meals. ? Avoid canned, cured, or processed meats  Buy breads that have less than 80 mg of sodium per slice. Cooking  Eat more home-cooked food and less  restaurant, buffet, and fast food.  Avoid adding salt when cooking. Use salt-free seasonings or herbs instead of table salt or sea salt. Check with your health care provider or pharmacist before using salt substitutes.  Cook with plant-based oils, such as canola, sunflower, or olive oil. Meal planning  When eating at a restaurant, ask that your food be prepared with less salt or no salt, if possible.  Avoid foods that contain MSG (monosodium glutamate). MSG is sometimes added to [place] food, bouillon, and some canned foods. What foods are recommended? The items listed may not be a complete list. Talk with your dietitian about what dietary choices are best for you. Grains Low-sodium cereals, including oats, puffed wheat and rice, and shredded wheat. Low-sodium crackers. Unsalted rice. Unsalted pasta. Low-sodium bread. Whole-grain breads and whole-grain pasta. Vegetables Fresh or frozen vegetables. "No salt added" canned vegetables. "No salt added" tomato sauce and paste. Low-sodium or reduced-sodium tomato and vegetable juice. Fruits Fresh, frozen, or canned fruit. Fruit juice. Meats and other protein foods Fresh or frozen (no salt added) meat, poultry, seafood, and fish. Low-sodium canned tuna and salmon. Unsalted nuts. Dried peas, beans, and lentils without added salt. Unsalted canned beans. Eggs. Unsalted nut butters. Dairy Milk. Soy milk. Cheese that is naturally low in sodium, such as ricotta cheese, fresh mozzarella, or Swiss cheese Low-sodium or reduced-sodium cheese. Cream cheese. Yogurt. Fats and oils Unsalted butter. Unsalted margarine with no trans fat. Vegetable oils such as canola or olive oils. Seasonings and other foods Fresh  and dried herbs and spices. Salt-free seasonings. Low-sodium mustard and ketchup. Sodium-free salad dressing. Sodium-free light mayonnaise. Fresh or refrigerated horseradish. Lemon juice. Vinegar. Homemade, reduced-sodium, or low-sodium soups. Unsalted  popcorn and pretzels. Low-salt or salt-free chips. What foods are not recommended? The items listed may not be a complete list. Talk with your dietitian about what dietary choices are best for you. Grains Instant hot cereals. Bread stuffing, pancake, and biscuit mixes. Croutons. Seasoned rice or pasta mixes. Noodle soup cups. Boxed or frozen macaroni and cheese. Regular salted crackers. Self-rising flour. Vegetables Sauerkraut, pickled vegetables, and relishes. Olives. [place] fries. Onion rings. Regular canned vegetables (not low-sodium or reduced-sodium). Regular canned tomato sauce and paste (not low-sodium or reduced-sodium). Regular tomato and vegetable juice (not low-sodium or reduced-sodium). Frozen vegetables in sauces. Meats and other protein foods Meat or fish that is salted, canned, smoked, spiced, or pickled. Bacon, ham, sausage, hotdogs, corned beef, chipped beef, packaged lunch meats, salt pork, jerky, pickled herring, anchovies, regular canned tuna, sardines, salted nuts. Dairy Processed cheese and cheese spreads. Cheese curds. Blue cheese. Feta cheese. String cheese. Regular cottage cheese. Buttermilk. Canned milk. Fats and oils Salted butter. Regular margarine. Ghee. Bacon fat. Seasonings and other foods Onion salt, garlic salt, seasoned salt, table salt, and sea salt. Canned and packaged gravies. Worcestershire sauce. Tartar sauce. Barbecue sauce. Teriyaki sauce. Soy sauce, including reduced-sodium. Steak sauce. Fish sauce. Oyster sauce. Cocktail sauce. Horseradish that you find on the shelf. Regular ketchup and mustard. Meat flavorings and tenderizers. Bouillon cubes. Hot sauce and Tabasco sauce. Premade or packaged marinades. Premade or packaged taco seasonings. Relishes. Regular salad dressings. Salsa. Potato and tortilla chips. Corn chips and puffs. Salted popcorn and pretzels. Canned or dried soups. Pizza. Frozen entrees and pot pies. Summary  Eating less sodium can help lower  your blood pressure, reduce swelling, and protect your heart, liver, and kidneys.  Most people on this plan should limit their sodium intake to 1,500-2,000 mg (milligrams) of sodium each day.  Canned, boxed, and frozen foods are high in sodium. Restaurant foods, fast foods, and pizza are also very high in sodium. You also get sodium by adding salt to food.  Try to cook at home, eat more fresh fruits and vegetables, and eat less fast food, canned, processed, or prepared foods. This information is not intended to replace advice given to you by your health care provider. Make sure you discuss any questions you have with your health care provider. Document Released: 06/05/2002 Document Revised: 12/07/2016 Document Reviewed: 12/07/2016 Elsevier Interactive Patient Education  2017 [place] for Gastroesophageal Reflux Disease, Adult When you have gastroesophageal reflux disease (GERD), the foods you eat and your eating habits are very important. Choosing the right foods can help ease the discomfort of GERD. Consider working with a diet and nutrition specialist (dietitian) to help you make healthy food choices. What general guidelines should I follow? Eating plan  Choose healthy foods low in fat, such as fruits, vegetables, whole grains, low-fat dairy products, and lean meat, fish, and poultry.  Eat frequent, small meals instead of three large meals each day. Eat your meals slowly, in a relaxed setting. Avoid bending over or lying down until 2-3 hours after eating.  Limit high-fat foods such as fatty meats or fried foods.  Limit your intake of oils, butter, and shortening to less than 8 teaspoons each day.  Avoid the following: ? Foods that cause symptoms. These may be different for different people. Keep a  food diary to keep track of foods that cause symptoms. ? Alcohol. ? Drinking large amounts of liquid with meals. ? Eating meals during the 2-3 hours before bed.  Cook  foods using methods other than frying. This may include baking, grilling, or broiling. Lifestyle   Maintain a healthy weight. Ask your health care provider what weight is healthy for you. If you need to lose weight, work with your health care provider to do so safely.  Exercise for at least 30 minutes on 5 or more days each week, or as told by your health care provider.  Avoid wearing clothes that fit tightly around your waist and chest.  Do not use any products that contain nicotine or tobacco, such as cigarettes and e-cigarettes. If you need help quitting, ask your health care provider.  Sleep with the head of your bed raised. Use a wedge under the mattress or blocks under the bed frame to raise the head of the bed. What foods are not recommended? The items listed may not be a complete list. Talk with your dietitian about what dietary choices are best for you. Grains Pastries or quick breads with added fat. [place] toast. Vegetables Deep fried vegetables. [place] fries. Any vegetables prepared with added fat. Any vegetables that cause symptoms. For some people this may include tomatoes and tomato products, chili peppers, onions and garlic, and horseradish. Fruits Any fruits prepared with added fat. Any fruits that cause symptoms. For some people this may include citrus fruits, such as oranges, grapefruit, pineapple, and lemons. Meats and other protein foods High-fat meats, such as fatty beef or pork, hot dogs, ribs, ham, sausage, salami and bacon. Fried meat or protein, including fried fish and fried chicken. Nuts and nut butters. Dairy Whole milk and chocolate milk. Sour cream. Cream. Ice cream. Cream cheese. Milk shakes. Beverages Coffee and tea, with or without caffeine. Carbonated beverages. Sodas. Energy drinks. Fruit juice made with acidic fruits (such as orange or grapefruit). Tomato juice. Alcoholic drinks. Fats and oils Butter. Margarine. Shortening. Ghee. Sweets and  desserts Chocolate and cocoa. Donuts. Seasoning and other foods Pepper. Peppermint and spearmint. Any condiments, herbs, or seasonings that cause symptoms. For some people, this may include curry, hot sauce, or vinegar-based salad dressings. Summary  When you have gastroesophageal reflux disease (GERD), food and lifestyle choices are very important to help ease the discomfort of GERD.  Eat frequent, small meals instead of three large meals each day. Eat your meals slowly, in a relaxed setting. Avoid bending over or lying down until 2-3 hours after eating.  Limit high-fat foods such as fatty meat or fried foods. This information is not intended to replace advice given to you by your health care provider. Make sure you discuss any questions you have with your health care provider. Document Released: 12/14/2005 Document Revised: 12/15/2016 Document Reviewed: 12/15/2016 Elsevier Interactive Patient Education  2017 [employer].

## 2017-11-02 LAB — CBC WITH DIFFERENTIAL/PLATELET
BASOS ABS: 70 {cells}/uL (ref 0–200)
BASOS PCT: 0.7 %
EOS PCT: 3.2 %
Eosinophils Absolute: 320 cells/uL (ref 15–500)
HCT: 40.8 % (ref 35.0–45.0)
HEMOGLOBIN: 14 g/dL (ref 11.7–15.5)
Lymphs Abs: 2220 cells/uL (ref 850–3900)
MCH: 28.5 pg (ref 27.0–33.0)
MCHC: 34.3 g/dL (ref 32.0–36.0)
MCV: 82.9 fL (ref 80.0–100.0)
MONOS PCT: 6.2 %
MPV: 12.2 fL (ref 7.5–12.5)
NEUTROS ABS: 6770 {cells}/uL (ref 1500–7800)
Neutrophils Relative %: 67.7 %
PLATELETS: 321 10*3/uL (ref 140–400)
RBC: 4.92 10*6/uL (ref 3.80–5.10)
RDW: 12.8 % (ref 11.0–15.0)
TOTAL LYMPHOCYTE: 22.2 %
WBC mixed population: 620 cells/uL (ref 200–950)
WBC: 10 10*3/uL (ref 3.8–10.8)

## 2017-11-02 LAB — LIPID PANEL
CHOLESTEROL: 194 mg/dL (ref <200)
HDL: 60 mg/dL (ref >50)
LDL CHOLESTEROL (CALC): 113 mg/dL — AB
Non-HDL Cholesterol (Calc): 134 mg/dL (calc) — ABNORMAL HIGH (ref <130)
Total CHOL/HDL Ratio: 3.2 (calc) (ref <5.0)
Triglycerides: 105 mg/dL (ref <150)

## 2017-11-02 LAB — COMPREHENSIVE METABOLIC PANEL
AG RATIO: 1.3 (calc) (ref 1.0–2.5)
ALT: 36 U/L — AB (ref 6–29)
AST: 34 U/L (ref 10–35)
Albumin: 4.3 g/dL (ref 3.6–5.1)
Alkaline phosphatase (APISO): 120 U/L — ABNORMAL HIGH (ref 33–115)
BILIRUBIN TOTAL: 0.6 mg/dL (ref 0.2–1.2)
BUN/Creatinine Ratio: 8 (calc) (ref 6–22)
BUN: 6 mg/dL — ABNORMAL LOW (ref 7–25)
CALCIUM: 9.5 mg/dL (ref 8.6–10.2)
CO2: 26 mmol/L (ref 20–32)
Chloride: 103 mmol/L (ref 98–110)
Creat: 0.71 mg/dL (ref 0.50–1.10)
Globulin: 3.2 g/dL (calc) (ref 1.9–3.7)
Glucose, Bld: 100 mg/dL — ABNORMAL HIGH (ref 65–99)
Potassium: 3.9 mmol/L (ref 3.5–5.3)
Sodium: 138 mmol/L (ref 135–146)
Total Protein: 7.5 g/dL (ref 6.1–8.1)

## 2017-11-02 LAB — HEMOGLOBIN A1C
EAG (MMOL/L): 6.5 (calc)
HEMOGLOBIN A1C: 5.7 %{Hb} — AB (ref <5.7)
Mean Plasma Glucose: 117 (calc)

## 2017-11-15 ENCOUNTER — Encounter: Payer: Self-pay | Admitting: Medical

## 2017-11-15 ENCOUNTER — Other Ambulatory Visit: Payer: Self-pay

## 2017-11-15 ENCOUNTER — Ambulatory Visit (INDEPENDENT_AMBULATORY_CARE_PROVIDER_SITE_OTHER): Payer: BLUE CROSS/BLUE SHIELD | Admitting: Medical

## 2017-11-15 VITALS — BP 110/80 | HR 65 | Wt 229.6 lb

## 2017-11-15 DIAGNOSIS — E559 Vitamin D deficiency, unspecified: Secondary | ICD-10-CM | POA: Diagnosis not present

## 2017-11-15 DIAGNOSIS — R945 Abnormal results of liver function studies: Secondary | ICD-10-CM | POA: Diagnosis not present

## 2017-11-15 DIAGNOSIS — E876 Hypokalemia: Secondary | ICD-10-CM | POA: Diagnosis not present

## 2017-11-15 DIAGNOSIS — R748 Abnormal levels of other serum enzymes: Secondary | ICD-10-CM

## 2017-11-15 DIAGNOSIS — I10 Essential (primary) hypertension: Secondary | ICD-10-CM | POA: Diagnosis not present

## 2017-11-15 DIAGNOSIS — K219 Gastro-esophageal reflux disease without esophagitis: Secondary | ICD-10-CM

## 2017-11-15 DIAGNOSIS — R7989 Other specified abnormal findings of blood chemistry: Secondary | ICD-10-CM | POA: Insufficient documentation

## 2017-11-15 MED ORDER — POTASSIUM CHLORIDE CRYS ER 10 MEQ PO TBCR: EXTENDED_RELEASE_TABLET | ORAL | 0 refills | Status: DC

## 2017-11-15 MED ORDER — AMLODIPINE BESYLATE 5 MG PO TABS: 5.0000 mg | ORAL_TABLET | Freq: Every day | ORAL | 1 refills | Status: DC

## 2017-11-15 MED ORDER — HYDROCHLOROTHIAZIDE 25 MG PO TABS: 25.0000 mg | ORAL_TABLET | Freq: Every day | ORAL | 1 refills | Status: DC

## 2017-11-15 MED ORDER — AMLODIPINE BESYLATE 5 MG PO TABS: 5.0000 mg | ORAL_TABLET | Freq: Every day | ORAL | 0 refills | Status: DC

## 2017-11-15 NOTE — Progress Notes (Signed)
Subjective: Chief Complaint  Patient presents with  . med check    med check   Here for med check.    Saw Dr. Tomi Bamberger here recently for elevated BPs.   Since last visit with me over a year ago at one point she quit taking her medications.  She had decided she didn't want to take a medication every days, and has been bad about compliance in general.   recently though was getting thirsty a lot, was urinating a lot, getting flushed feeling, fatigued.   BPs have been elevated of late.  In general she has been taking her potassium every other day.   Last visit she was having GERD, but is improved on medication from recent visit here.  No other aggravating or relieving factors. No other complaint.  Past Medical History:  Diagnosis Date  . Anxiety   . History of mammogram    through gyn  . Hypertension   . Routine gynecological examination    Dr. Helane Rima  . Wears glasses    astigmatism   Current Outpatient Medications on File Prior to Visit  Medication Sig Dispense Refill  . esomeprazole (NEXIUM) 20 MG capsule Take 20 mg daily at 12 noon by mouth.    Marland Kitchen ibuprofen (ADVIL,MOTRIN) 200 MG tablet Take 400 mg by mouth every 6 (six) hours as needed. Reported on 07/13/2016     No current facility-administered medications on file prior to visit.    ROS as in subjective   Objective: BP 110/80   Pulse 65   Wt 229 lb 9.6 oz (104.1 kg)   SpO2 97%   BMI 38.21 kg/m    BP Readings from Last 3 Encounters:  11/15/17 110/80  11/01/17 (!) 142/100  07/13/16 (!) 138/96    Wt Readings from Last 3 Encounters:  11/15/17 229 lb 9.6 oz (104.1 kg)  11/01/17 229 lb 9.6 oz (104.1 kg)  07/13/16 225 lb (102.1 kg)   General appearance: alert, no distress, WD/WN,  Neck: supple, no lymphadenopathy, no thyromegaly, no masses Heart: RRR, normal S1, S2, no murmurs Lungs: CTA bilaterally, no wheezes, rhonchi, or rales Abdomen: +bs, soft, non tender, non distended, no masses, no hepatomegaly, no  splenomegaly Pulses: 2+ symmetric, upper and lower extremities, normal cap refill Ext: no edema Neuro: nonfocal exam   Assessment: Encounter Diagnoses  Name Primary?  . Essential hypertension Yes  . Hypokalemia   . Gastroesophageal reflux disease without esophagitis   . Alkaline phosphatase elevation   . Vitamin D deficiency   . Elevated LFTs     Plan: Discussed her diagnoses, need for compliance.   C/t current medications, return in 2 mo for nurse visit for labs.   Discussed need for healthy diet, routine exercise, losing weight, limiting salt intake.    Advised daily potassium supplement not QOD, advised of differential for LFT  And ALP elevation.   Plan repeat lab in mo.  Xolani was seen today for med check.  Diagnoses and all orders for this visit:  Essential hypertension  Hypokalemia -     Comprehensive metabolic panel; Future  Gastroesophageal reflux disease without esophagitis  Alkaline phosphatase elevation -     Comprehensive metabolic panel; Future  Vitamin D deficiency -     VITAMIN D 25 Hydroxy (Vit-D Deficiency, Fractures); Future  Elevated LFTs  Other orders -     amLODipine (NORVASC) 5 MG tablet; Take 1 tablet (5 mg total) daily by mouth. -     potassium chloride (KLOR-CON M10) 10  MEQ tablet; TAKE 1 TABLET BY MOUTH EVERY DAY (FILLABLE 10-02-16) -     hydrochlorothiazide (HYDRODIURIL) 25 MG tablet; Take 1 tablet (25 mg total) daily by mouth.

## 2017-11-22 DIAGNOSIS — Z1231 Encounter for screening mammogram for malignant neoplasm of breast: Secondary | ICD-10-CM | POA: Diagnosis not present

## 2017-11-27 DIAGNOSIS — Z23 Encounter for immunization: Secondary | ICD-10-CM | POA: Diagnosis not present

## 2017-11-30 DIAGNOSIS — H2513 Age-related nuclear cataract, bilateral: Secondary | ICD-10-CM | POA: Diagnosis not present

## 2017-11-30 DIAGNOSIS — D23111 Other benign neoplasm of skin of right upper eyelid, including canthus: Secondary | ICD-10-CM | POA: Diagnosis not present

## 2017-12-16 DIAGNOSIS — I10 Essential (primary) hypertension: Secondary | ICD-10-CM | POA: Diagnosis not present

## 2017-12-16 DIAGNOSIS — Z1212 Encounter for screening for malignant neoplasm of rectum: Secondary | ICD-10-CM | POA: Diagnosis not present

## 2017-12-16 DIAGNOSIS — Z01419 Encounter for gynecological examination (general) (routine) without abnormal findings: Secondary | ICD-10-CM | POA: Diagnosis not present

## 2017-12-16 DIAGNOSIS — Z6829 Body mass index (BMI) 29.0-29.9, adult: Secondary | ICD-10-CM | POA: Diagnosis not present

## 2017-12-16 DIAGNOSIS — Z01411 Encounter for gynecological examination (general) (routine) with abnormal findings: Secondary | ICD-10-CM | POA: Diagnosis not present

## 2018-01-17 ENCOUNTER — Other Ambulatory Visit: Payer: BLUE CROSS/BLUE SHIELD

## 2018-01-20 ENCOUNTER — Other Ambulatory Visit (INDEPENDENT_AMBULATORY_CARE_PROVIDER_SITE_OTHER): Payer: BLUE CROSS/BLUE SHIELD

## 2018-01-20 DIAGNOSIS — E559 Vitamin D deficiency, unspecified: Secondary | ICD-10-CM

## 2018-01-20 DIAGNOSIS — R748 Abnormal levels of other serum enzymes: Secondary | ICD-10-CM | POA: Diagnosis not present

## 2018-01-20 DIAGNOSIS — E876 Hypokalemia: Secondary | ICD-10-CM

## 2018-01-20 NOTE — Addendum Note (Signed)
Addended by: Tyrone Apple on: 01/20/2018 08:44 AM   Modules accepted: Orders

## 2018-01-21 ENCOUNTER — Other Ambulatory Visit: Payer: Self-pay | Admitting: Medical

## 2018-01-21 DIAGNOSIS — R748 Abnormal levels of other serum enzymes: Secondary | ICD-10-CM

## 2018-01-21 LAB — COMPREHENSIVE METABOLIC PANEL
ALBUMIN: 4 g/dL (ref 3.5–5.5)
ALK PHOS: 121 IU/L — AB (ref 39–117)
ALT: 31 IU/L (ref 0–32)
AST: 18 IU/L (ref 0–40)
Albumin/Globulin Ratio: 1.4 (ref 1.2–2.2)
BUN / CREAT RATIO: 10 (ref 9–23)
BUN: 8 mg/dL (ref 6–24)
Bilirubin Total: 0.2 mg/dL (ref 0.0–1.2)
CO2: 25 mmol/L (ref 20–29)
CREATININE: 0.79 mg/dL (ref 0.57–1.00)
Calcium: 9.7 mg/dL (ref 8.7–10.2)
Chloride: 102 mmol/L (ref 96–106)
GFR calc Af Amer: 104 mL/min/{1.73_m2} (ref >59)
GFR, EST NON AFRICAN AMERICAN: 90 mL/min/{1.73_m2} (ref >59)
GLOBULIN, TOTAL: 2.8 g/dL (ref 1.5–4.5)
Glucose: 120 mg/dL — ABNORMAL HIGH (ref 65–99)
Potassium: 4 mmol/L (ref 3.5–5.2)
SODIUM: 141 mmol/L (ref 134–144)
Total Protein: 6.8 g/dL (ref 6.0–8.5)

## 2018-01-21 LAB — VITAMIN D 25 HYDROXY (VIT D DEFICIENCY, FRACTURES): VIT D 25 HYDROXY: 32.8 ng/mL (ref 30.0–100.0)

## 2018-01-23 LAB — ALKALINE PHOSPHATASE, ISOENZYMES
Alkaline Phosphatase: 118 IU/L — ABNORMAL HIGH (ref 39–117)
BONE FRACTION: 27 % (ref 14–68)
INTESTINAL FRAC.: 4 % (ref 0–18)
LIVER FRACTION: 69 % (ref 18–85)

## 2018-01-23 LAB — SPECIMEN STATUS REPORT

## 2018-01-25 ENCOUNTER — Other Ambulatory Visit: Payer: Self-pay | Admitting: Medical

## 2018-01-25 MED ORDER — VITAMIN D (ERGOCALCIFEROL) 1.25 MG (50000 UNIT) PO CAPS: 50000.0000 [IU] | ORAL_CAPSULE | ORAL | 0 refills | Status: DC

## 2018-01-25 MED ORDER — POTASSIUM CHLORIDE CRYS ER 10 MEQ PO TBCR: EXTENDED_RELEASE_TABLET | ORAL | 3 refills | Status: DC

## 2018-02-22 ENCOUNTER — Other Ambulatory Visit: Payer: Self-pay | Admitting: Medical

## 2018-03-07 ENCOUNTER — Encounter: Payer: Self-pay | Admitting: Medical

## 2018-03-07 ENCOUNTER — Ambulatory Visit (INDEPENDENT_AMBULATORY_CARE_PROVIDER_SITE_OTHER): Payer: BLUE CROSS/BLUE SHIELD | Admitting: Medical

## 2018-03-07 VITALS — BP 132/80 | HR 57 | Temp 98.5°F | Wt 228.2 lb

## 2018-03-07 DIAGNOSIS — E559 Vitamin D deficiency, unspecified: Secondary | ICD-10-CM | POA: Diagnosis not present

## 2018-03-07 DIAGNOSIS — I10 Essential (primary) hypertension: Secondary | ICD-10-CM

## 2018-03-07 DIAGNOSIS — R748 Abnormal levels of other serum enzymes: Secondary | ICD-10-CM

## 2018-03-07 DIAGNOSIS — H938X1 Other specified disorders of right ear: Secondary | ICD-10-CM

## 2018-03-07 DIAGNOSIS — J069 Acute upper respiratory infection, unspecified: Secondary | ICD-10-CM

## 2018-03-07 DIAGNOSIS — R945 Abnormal results of liver function studies: Secondary | ICD-10-CM | POA: Diagnosis not present

## 2018-03-07 NOTE — Progress Notes (Signed)
Subjective: Chief Complaint  Patient presents with  . Follow-up    follow up vit d ,  high b/p feels like fluid in in her ear    Saw gynecology recently , BP was elevated, was advised by gynecology to restart HCTZ.  She gets anxiety going to gynecology and dentist, so felt her BP was up due to white coat hypertension.    Last visit ALP was elevated as well as glucose, Vit D was low normal.   Was advised to start Vit D supplement.     HTN - compliant with HCTZ 25mg  daily, potassium 54meq daily, Amlodipine 5mg  daily.  Not checking BPs at time.  having some recent cold symptoms, ear popping on right, improved some.  otherwise feeling fine.   Past Medical History:  Diagnosis Date  . Anxiety   . History of mammogram    through gyn  . Hypertension   . Routine gynecological examination    Dr. Helane Rima  . Wears glasses    astigmatism   Current Outpatient Medications on File Prior to Visit  Medication Sig Dispense Refill  . amLODipine (NORVASC) 5 MG tablet TAKE 1 TABLET (5 MG TOTAL) DAILY BY MOUTH. 90 tablet 0  . esomeprazole (NEXIUM) 20 MG capsule Take 20 mg daily at 12 noon by mouth.    . hydrochlorothiazide (HYDRODIURIL) 25 MG tablet Take 1 tablet (25 mg total) daily by mouth. 90 tablet 1  . ibuprofen (ADVIL,MOTRIN) 200 MG tablet Take 400 mg by mouth every 6 (six) hours as needed. Reported on 07/13/2016    . potassium chloride (KLOR-CON M10) 10 MEQ tablet TAKE 1 TABLET BY MOUTH EVERY DAY (FILLABLE 10-02-16) 90 tablet 3  . Vitamin D, Ergocalciferol, (DRISDOL) 50000 units CAPS capsule Take 1 capsule (50,000 Units total) by mouth every 7 (seven) days. 12 capsule 0   No current facility-administered medications on file prior to visit.    ROS as in subjective    Objective: BP 132/80   Pulse (!) 57   Temp 98.5 F (36.9 C)   Wt 228 lb 3.2 oz (103.5 kg)   SpO2 96%   BMI 37.97 kg/m   BP Readings from Last 3 Encounters:  03/07/18 132/80  11/15/17 110/80  11/01/17 (!) 142/100   Wt  Readings from Last 3 Encounters:  03/07/18 228 lb 3.2 oz (103.5 kg)  11/15/17 229 lb 9.6 oz (104.1 kg)  11/01/17 229 lb 9.6 oz (104.1 kg)   General appearance: alert, no distress, WD/WN,  HEENT: normocephalic, sclerae anicteric, TMs pearly, nares patent, no discharge or erythema, pharynx normal Oral cavity: MMM, no lesions Neck: supple, no lymphadenopathy, no thyromegaly, no masses Heart: RRR, normal S1, S2, no murmurs Lungs: CTA bilaterally, no wheezes, rhonchi, or rales Abdomen: +bs, soft, non tender, non distended, no masses, no hepatomegaly, no splenomegaly Pulses: 2+ symmetric, upper and lower extremities, normal cap refill Ext: no edema     Assessment: Encounter Diagnoses  Name Primary?  . Essential hypertension Yes  . Alkaline phosphatase elevation   . Vitamin D deficiency   . Pressure sensation in right ear   . Upper respiratory tract infection, unspecified type      Plan: HTN - c/t Amlodipine 5mg , c/t HCTZ 25mg  daily, counseled on diet, exercise, and salt limitations.  Monitor BP 2-3 days per week and get me some BP readings in a month.  ALP elevation - discussed possible differential.  repeat labs today  Vit D deficiency - c/t weekly Vit D. Lab today.  URI - discussed supportive care, can use Coricidin HBP, but avoid sinus decongestants due to hypertension.  Lakely was seen today for follow-up.  Diagnoses and all orders for this visit:  Essential hypertension  Alkaline phosphatase elevation -     Hepatic function panel  Vitamin D deficiency -     VITAMIN D 25 Hydroxy (Vit-D Deficiency, Fractures)  Pressure sensation in right ear  Upper respiratory tract infection, unspecified type

## 2018-03-08 ENCOUNTER — Other Ambulatory Visit: Payer: Self-pay | Admitting: Medical

## 2018-03-08 DIAGNOSIS — R748 Abnormal levels of other serum enzymes: Secondary | ICD-10-CM

## 2018-03-08 LAB — HEPATIC FUNCTION PANEL
ALT: 34 IU/L — AB (ref 0–32)
AST: 23 IU/L (ref 0–40)
Albumin: 4.3 g/dL (ref 3.5–5.5)
Alkaline Phosphatase: 137 IU/L — ABNORMAL HIGH (ref 39–117)
Bilirubin Total: 0.4 mg/dL (ref 0.0–1.2)
Bilirubin, Direct: 0.09 mg/dL (ref 0.00–0.40)
Total Protein: 7.3 g/dL (ref 6.0–8.5)

## 2018-03-08 LAB — VITAMIN D 25 HYDROXY (VIT D DEFICIENCY, FRACTURES): Vit D, 25-Hydroxy: 27.9 ng/mL — ABNORMAL LOW (ref 30.0–100.0)

## 2018-03-10 ENCOUNTER — Other Ambulatory Visit: Payer: Self-pay | Admitting: Medical

## 2018-03-10 ENCOUNTER — Telehealth: Payer: Self-pay

## 2018-03-10 DIAGNOSIS — R7989 Other specified abnormal findings of blood chemistry: Secondary | ICD-10-CM

## 2018-03-10 DIAGNOSIS — R945 Abnormal results of liver function studies: Principal | ICD-10-CM

## 2018-03-10 MED ORDER — VITAMIN D3 50 MCG (2000 UT) PO TABS: 1.0000 | ORAL_TABLET | Freq: Every day | ORAL | 1 refills | Status: DC

## 2018-03-10 NOTE — Telephone Encounter (Signed)
Called patient and advised of lab results, patient is aware she will be contacted regarding ultrasound.   Patient does have a questions regarding the vitamin D, she states that since she started the vitamin D it has worsened since taking it once weekly. She would like to know if she should increase the vitamin D or what she should do to increase her vitamin D level.  Thank you!

## 2018-03-18 ENCOUNTER — Encounter: Payer: Self-pay | Admitting: Medical

## 2018-03-21 ENCOUNTER — Other Ambulatory Visit: Payer: Self-pay

## 2018-03-23 LAB — SPECIMEN STATUS REPORT

## 2018-03-23 LAB — ALKALINE PHOSPHATASE, ISOENZYMES
ALK PHOS: 139 IU/L — AB (ref 39–117)
BONE FRACTION: 24 % (ref 14–68)
INTESTINAL FRAC.: 2 % (ref 0–18)
LIVER FRACTION: 74 % (ref 18–85)

## 2018-03-28 ENCOUNTER — Other Ambulatory Visit: Payer: Self-pay

## 2018-04-16 ENCOUNTER — Other Ambulatory Visit: Payer: Self-pay | Admitting: Medical

## 2018-04-18 NOTE — Telephone Encounter (Signed)
Pt was prescribed 2,000 units instead of 50,000 weekly

## 2018-05-30 DIAGNOSIS — D23111 Other benign neoplasm of skin of right upper eyelid, including canthus: Secondary | ICD-10-CM | POA: Diagnosis not present

## 2018-05-30 DIAGNOSIS — H2513 Age-related nuclear cataract, bilateral: Secondary | ICD-10-CM | POA: Diagnosis not present

## 2018-06-02 ENCOUNTER — Other Ambulatory Visit: Payer: Self-pay | Admitting: Medical

## 2018-06-02 NOTE — Telephone Encounter (Signed)
This refill came in, just want to double check-is she now due for the alk p in the system?

## 2018-06-02 NOTE — Telephone Encounter (Signed)
Patient couldn't afford due to insurance (see note in system) you agreed to do Alk P. She will come in Monday for this lab.

## 2018-06-02 NOTE — Telephone Encounter (Signed)
Last visit I advised abdominal ultrasound.   Check to see if we set this up or did she decline?    Can send refill

## 2018-06-03 ENCOUNTER — Other Ambulatory Visit: Payer: Self-pay | Admitting: Medical

## 2018-06-06 ENCOUNTER — Other Ambulatory Visit: Payer: BLUE CROSS/BLUE SHIELD

## 2018-06-06 DIAGNOSIS — R748 Abnormal levels of other serum enzymes: Secondary | ICD-10-CM | POA: Diagnosis not present

## 2018-06-07 LAB — ALKALINE PHOSPHATASE, ISOENZYMES
ALK PHOS: 116 IU/L (ref 39–117)
BONE FRACTION: 26 % (ref 14–68)
INTESTINAL FRAC.: 16 % (ref 0–18)
LIVER FRACTION: 58 % (ref 18–85)

## 2018-06-08 ENCOUNTER — Other Ambulatory Visit: Payer: Self-pay | Admitting: Medical

## 2018-06-08 MED ORDER — VITAMIN D3 50 MCG (2000 UT) PO TABS: 1.0000 | ORAL_TABLET | Freq: Every day | ORAL | 1 refills | Status: DC

## 2018-08-26 ENCOUNTER — Other Ambulatory Visit: Payer: Self-pay | Admitting: Medical

## 2018-11-27 ENCOUNTER — Other Ambulatory Visit: Payer: Self-pay | Admitting: Medical

## 2018-11-28 ENCOUNTER — Other Ambulatory Visit: Payer: Self-pay | Admitting: Medical

## 2018-12-14 ENCOUNTER — Other Ambulatory Visit: Payer: Self-pay | Admitting: Medical

## 2018-12-14 MED ORDER — OSELTAMIVIR PHOSPHATE 75 MG PO CAPS: 75.0000 mg | ORAL_CAPSULE | Freq: Every day | ORAL | 0 refills | Status: DC

## 2019-01-04 ENCOUNTER — Encounter: Payer: Self-pay | Admitting: Medical

## 2019-02-27 ENCOUNTER — Other Ambulatory Visit: Payer: Self-pay | Admitting: Medical

## 2019-02-27 NOTE — Telephone Encounter (Signed)
Is this ok to refill?  

## 2019-02-27 NOTE — Telephone Encounter (Signed)
Get in for physical, refill 30-day supply

## 2019-02-28 NOTE — Telephone Encounter (Signed)
Patient scheduled appointment on 03-14-19

## 2019-03-14 ENCOUNTER — Encounter: Payer: Self-pay | Admitting: Medical

## 2019-05-25 ENCOUNTER — Encounter: Payer: BLUE CROSS/BLUE SHIELD | Admitting: Medical

## 2019-05-31 ENCOUNTER — Ambulatory Visit (INDEPENDENT_AMBULATORY_CARE_PROVIDER_SITE_OTHER): Payer: BC Managed Care – PPO | Admitting: Medical

## 2019-05-31 ENCOUNTER — Other Ambulatory Visit: Payer: Self-pay

## 2019-05-31 ENCOUNTER — Encounter: Payer: Self-pay | Admitting: Medical

## 2019-05-31 VITALS — BP 136/80 | HR 70 | Temp 98.0°F | Resp 16 | Ht 65.0 in | Wt 235.6 lb

## 2019-05-31 DIAGNOSIS — I10 Essential (primary) hypertension: Secondary | ICD-10-CM | POA: Diagnosis not present

## 2019-05-31 DIAGNOSIS — E876 Hypokalemia: Secondary | ICD-10-CM | POA: Diagnosis not present

## 2019-05-31 DIAGNOSIS — R7301 Impaired fasting glucose: Secondary | ICD-10-CM | POA: Diagnosis not present

## 2019-05-31 DIAGNOSIS — R002 Palpitations: Secondary | ICD-10-CM | POA: Insufficient documentation

## 2019-05-31 DIAGNOSIS — E669 Obesity, unspecified: Secondary | ICD-10-CM | POA: Diagnosis not present

## 2019-05-31 DIAGNOSIS — K219 Gastro-esophageal reflux disease without esophagitis: Secondary | ICD-10-CM

## 2019-05-31 DIAGNOSIS — Z0000 Encounter for general adult medical examination without abnormal findings: Secondary | ICD-10-CM | POA: Diagnosis not present

## 2019-05-31 DIAGNOSIS — Z808 Family history of malignant neoplasm of other organs or systems: Secondary | ICD-10-CM | POA: Insufficient documentation

## 2019-05-31 DIAGNOSIS — E559 Vitamin D deficiency, unspecified: Secondary | ICD-10-CM

## 2019-05-31 MED ORDER — AMLODIPINE BESYLATE 5 MG PO TABS: 5.0000 mg | ORAL_TABLET | Freq: Every day | ORAL | 3 refills | Status: DC

## 2019-05-31 MED ORDER — VITAMIN D3 50 MCG (2000 UT) PO TABS: 2.0000 | ORAL_TABLET | Freq: Every day | ORAL | 3 refills | Status: DC

## 2019-05-31 MED ORDER — HYDROCHLOROTHIAZIDE 25 MG PO TABS: 25.0000 mg | ORAL_TABLET | Freq: Every day | ORAL | 3 refills | Status: DC

## 2019-05-31 NOTE — Progress Notes (Signed)
Subjective:   HPI  Jennifer Leonard is a 48 y.o. female who presents for a complete physical.  Medical care team includes:  Dr. Jerline Pain, ophthalmology  Dr. Marian Sorrow, dentist  Dr. Helane Rima, gynecology  Dr. Kennedy Bucker, GI  dermatology Dimitrious Micciche, Camelia Eng, PA-C  Here for primary care  Concerns: Her partner unfortunately recently had pacemaker due to heart block and also she had some recent finger amputations.  Sees dermatology yearly for surveillance.  Uncle and other family members have hx/o melanoma  Has occasional palpitation  Compliant with medications  Trying to really be careful and avoid covid given her partner's immunocompromised status.  Reviewed their medical, surgical, family, social, medication, and allergy history and updated chart as appropriate.  Past Medical History:  Diagnosis Date  . Anxiety   . History of mammogram    through gyn  . Hypertension   . Routine gynecological examination    Dr. Helane Rima  . Wears glasses    astigmatism    Past Surgical History:  Procedure Laterality Date  . CHOLECYSTECTOMY    . COLONOSCOPY  2011   benign polyp, Dr. Kennedy Bucker  . WISDOM TOOTH EXTRACTION      Social History   Socioeconomic History  . Marital status: Divorced    Spouse name: Not on file  . Number of children: Not on file  . Years of education: Not on file  . Highest education level: Not on file  Occupational History  . Not on file  Social Needs  . Financial resource strain: Not on file  . Food insecurity:    Worry: Not on file    Inability: Not on file  . Transportation needs:    Medical: Not on file    Non-medical: Not on file  Tobacco Use  . Smoking status: Never Smoker  . Smokeless tobacco: Never Used  Substance and Sexual Activity  . Alcohol use: Yes    Comment: socially  . Drug use: No  . Sexual activity: Not on file  Lifestyle  . Physical activity:    Days per week: Not on file    Minutes per session: Not on file  . Stress: Not  on file  Relationships  . Social connections:    Talks on phone: Not on file    Gets together: Not on file    Attends religious service: Not on file    Active member of club or organization: Not on file    Attends meetings of clubs or organizations: Not on file    Relationship status: Not on file  . Intimate partner violence:    Fear of current or ex partner: Not on file    Emotionally abused: Not on file    Physically abused: Not on file    Forced sexual activity: Not on file  Other Topics Concern  . Not on file  Social History Narrative   Married to female partner, and grandson live with her (daugher lives in [place]) .exercise- not much.  Has a bicycle.  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 Diabetes Mother   . Hypertension Mother   . Depression Mother   . Anxiety disorder Mother   . Hypertension Father   . Hyperlipidemia Father   . Depression Father   . Seizures Sister   . Depression Sister   . Cancer Maternal Aunt        breast  .  Heart disease Maternal Grandmother        valve disease  . Cancer Paternal Grandmother        breast  . Cancer Paternal Grandfather        lung  . Cancer Paternal Uncle        melanoma  . Heart disease Maternal Grandfather 90       MI  . Stroke Neg Hx      Current Outpatient Medications:  .  amLODipine (NORVASC) 5 MG tablet, Take 1 tablet (5 mg total) by mouth daily., Disp: 90 tablet, Rfl: 3 .  Cholecalciferol (VITAMIN D3) 50 MCG (2000 UT) TABS, Take 2 tablets by mouth daily., Disp: 180 tablet, Rfl: 3 .  esomeprazole (NEXIUM) 20 MG capsule, Take 20 mg daily at 12 noon by mouth., Disp: , Rfl:  .  hydrochlorothiazide (HYDRODIURIL) 25 MG tablet, Take 1 tablet (25 mg total) by mouth daily., Disp: 90 tablet, Rfl: 3 .  potassium chloride (KLOR-CON M10) 10 MEQ tablet, TAKE 1 TABLET BY MOUTH EVERY DAY (FILLABLE 10-02-16), Disp: 90 tablet, Rfl: 3 .  ibuprofen  (ADVIL,MOTRIN) 200 MG tablet, Take 400 mg by mouth every 6 (six) hours as needed. Reported on 07/13/2016, Disp: , Rfl:   Allergies  Allergen Reactions  . Sulfonamide Derivatives Rash    Review of Systems Constitutional: -fever, -chills, -sweats, -unexpected weight change, -decreased appetite, -fatigue Allergy: -sneezing, -itching, -congestion Dermatology: -changing moles, --rash, -lumps ENT: -runny nose, -ear pain, -sore throat, -hoarseness, -sinus pain, -teeth pain, - ringing in ears, -hearing loss, -nosebleeds Cardiology: -chest pain,+palpitations, -swelling, -difficulty breathing when lying flat, -waking up short of breath Respiratory: -cough, -shortness of breath, -difficulty breathing with exercise or exertion, -wheezing, -coughing up blood Gastroenterology: -abdominal pain, -nausea, -vomiting, -diarrhea, -constipation, -blood in stool, -changes in bowel movement, -difficulty swallowing or eating Hematology: -bleeding, -bruising  Musculoskeletal: -joint aches, -muscle aches, -joint swelling, -back pain, -neck pain, -cramping, -changes in gait Ophthalmology: denies vision changes, eye redness, itching, discharge Urology: -burning with urination, -difficulty urinating, -blood in urine, -urinary frequency, -urgency, -incontinence Neurology: -headache, -weakness, -tingling, -numbness, -memory loss, -falls, -dizziness Psychology: -depressed mood, -agitation, -sleep problems     Objective:   Physical Exam  BP 136/80   Pulse 70   Temp 98 F (36.7 C) (Oral)   Resp 16   Ht 5\' 5"  (1.651 m)   Wt 235 lb 9.6 oz (106.9 kg)   LMP 05/22/2019 (Exact Date)   SpO2 98%   BMI 39.21 kg/m   Wt Readings from Last 3 Encounters:  05/31/19 235 lb 9.6 oz (106.9 kg)  03/07/18 228 lb 3.2 oz (103.5 kg)  11/15/17 229 lb 9.6 oz (104.1 kg)   BP Readings from Last 3 Encounters:  05/31/19 136/80  03/07/18 132/80  11/15/17 110/80    General appearance: alert, no distress, WD/WN, obese white  female Skin: Scattered macules on the back and chest, no worrisome lesion HEENT: normocephalic, conjunctiva/corneas normal, sclerae anicteric, PERRLA, EOMi, nares patent, no discharge or erythema, pharynx normal Oral cavity: MMM, tongue normal, teeth in good repair Neck: supple, no lymphadenopathy, no thyromegaly, no masses, normal ROM, no bruits Chest: non tender, normal shape and expansion Heart: RRR, normal S1, S2, no murmurs Lungs: CTA bilaterally, no wheezes, rhonchi, or rales Abdomen: +bs, soft, RUQ port surgical scars, non tender, non distended, no masses, no hepatomegaly, no splenomegaly, no bruits Back: non tender, normal ROM, no scoliosis Musculoskeletal: upper extremities non tender, no obvious deformity, normal ROM throughout, lower extremities non tender,  no obvious deformity, normal ROM throughout Extremities: no edema, no cyanosis, no clubbing Pulses: 2+ symmetric, upper and lower extremities, normal cap refill Neurological: alert, oriented x 3, CN2-12 intact, strength normal upper extremities and lower extremities, sensation normal throughout, DTRs 2+ throughout, no cerebellar signs, gait normal Psychiatric: normal affect, behavior normal, pleasant  Breast/gyn/rectal - deferred to gyn   EKG Indication: palpitation Rage 53 bpm PR 120ms QRS 116ms QtC 476ms Axis 43 degrees Sinus bradycardia, no acute changes compared to 2017 EKG     Assessment and Plan :    Encounter Diagnoses  Name Primary?  . Encounter for health maintenance examination in adult Yes  . Hypokalemia   . Obesity with serious comorbidity, unspecified classification, unspecified obesity type   . Essential hypertension   . Gastroesophageal reflux disease without esophagitis   . Impaired fasting glucose   . Vitamin D deficiency   . Palpitation   . Family history of melanoma    Physical exam - discussed healthy lifestyle, diet, exercise, preventative care, vaccinations, and addressed their  concerns See your eye doctor yearly for routine vision care. See your dentist yearly for routine dental care including hygiene visits twice yearly. See your gynecologist yearly for routine gynecological care. See dermatology yearly Get flu shot yearly  Chronic issues: Hypertension - c/t current medications  Impaired fasting glucose - labs today  Obesity - work on lifestyle changes  Palpitations - infrequent, intermittent, no acute EKG changes.   If worsening, she will let me know.   Labs today.  We will request records from [place], Dr. Helane Rima regarding last pap, mammo  discussed colonoscopy screening.  Can do now or between now and age 87yo if insurance covers at this age  Shawnya was seen today for cpe.  Diagnoses and all orders for this visit:  Encounter for health maintenance examination in adult -     EKG 12-Lead -     Comprehensive metabolic panel -     Hemoglobin A1c  Hypokalemia  Obesity with serious comorbidity, unspecified classification, unspecified obesity type  Essential hypertension  Gastroesophageal reflux disease without esophagitis  Impaired fasting glucose -     Hemoglobin A1c  Vitamin D deficiency  Palpitation -     EKG 12-Lead  Family history of melanoma  Other orders -     hydrochlorothiazide (HYDRODIURIL) 25 MG tablet; Take 1 tablet (25 mg total) by mouth daily. -     amLODipine (NORVASC) 5 MG tablet; Take 1 tablet (5 mg total) by mouth daily. -     Cholecalciferol (VITAMIN D3) 50 MCG (2000 UT) TABS; Take 2 tablets by mouth daily.    Follow-up pending labs

## 2019-06-01 LAB — COMPREHENSIVE METABOLIC PANEL
ALT: 18 IU/L (ref 0–32)
AST: 17 IU/L (ref 0–40)
Albumin/Globulin Ratio: 1.5 (ref 1.2–2.2)
Albumin: 4.4 g/dL (ref 3.8–4.8)
Alkaline Phosphatase: 128 IU/L — ABNORMAL HIGH (ref 39–117)
BUN/Creatinine Ratio: 12 (ref 9–23)
BUN: 9 mg/dL (ref 6–24)
Bilirubin Total: 0.4 mg/dL (ref 0.0–1.2)
CO2: 22 mmol/L (ref 20–29)
Calcium: 9.4 mg/dL (ref 8.7–10.2)
Chloride: 103 mmol/L (ref 96–106)
Creatinine, Ser: 0.73 mg/dL (ref 0.57–1.00)
GFR calc Af Amer: 113 mL/min/{1.73_m2} (ref >59)
GFR calc non Af Amer: 98 mL/min/{1.73_m2} (ref >59)
Globulin, Total: 2.9 g/dL (ref 1.5–4.5)
Glucose: 104 mg/dL — ABNORMAL HIGH (ref 65–99)
Potassium: 3.5 mmol/L (ref 3.5–5.2)
Sodium: 142 mmol/L (ref 134–144)
Total Protein: 7.3 g/dL (ref 6.0–8.5)

## 2019-06-01 LAB — HEMOGLOBIN A1C
Est. average glucose Bld gHb Est-mCnc: 128 mg/dL
Hgb A1c MFr Bld: 6.1 % — ABNORMAL HIGH (ref 4.8–5.6)

## 2019-06-02 ENCOUNTER — Other Ambulatory Visit: Payer: Self-pay | Admitting: Medical

## 2019-11-08 DIAGNOSIS — Z23 Encounter for immunization: Secondary | ICD-10-CM | POA: Diagnosis not present

## 2019-11-14 ENCOUNTER — Other Ambulatory Visit: Payer: Self-pay

## 2019-11-14 DIAGNOSIS — Z20822 Contact with and (suspected) exposure to covid-19: Secondary | ICD-10-CM

## 2019-11-16 LAB — NOVEL CORONAVIRUS, NAA: SARS-CoV-2, NAA: NOT DETECTED

## 2019-12-28 ENCOUNTER — Encounter: Payer: Self-pay | Admitting: Family Medicine

## 2019-12-28 ENCOUNTER — Ambulatory Visit (INDEPENDENT_AMBULATORY_CARE_PROVIDER_SITE_OTHER): Payer: BC Managed Care – PPO | Admitting: Family Medicine

## 2019-12-28 ENCOUNTER — Other Ambulatory Visit: Payer: Self-pay

## 2019-12-28 VITALS — Temp 98.0°F | Ht 65.0 in | Wt 230.0 lb

## 2019-12-28 DIAGNOSIS — B029 Zoster without complications: Secondary | ICD-10-CM

## 2019-12-28 MED ORDER — VALACYCLOVIR HCL 1 G PO TABS: 1000.0000 mg | ORAL_TABLET | Freq: Three times a day (TID) | ORAL | 0 refills | Status: DC

## 2019-12-28 MED ORDER — MUPIROCIN 2 % EX OINT: 1.0000 "application " | TOPICAL_OINTMENT | Freq: Two times a day (BID) | CUTANEOUS | 0 refills | Status: DC

## 2019-12-28 NOTE — Progress Notes (Signed)
Start time: 11:16 End time: 11:34  Virtual Visit via Video Note  I connected with Jennifer Leonard on 12/28/19 at 10:45 AM EST by a video enabled telemedicine application and verified that I am speaking with the correct person using two identifiers.  Location: Patient: home Provider: office   I discussed the limitations of evaluation and management by telemedicine and the availability of in person appointments. The patient expressed understanding and agreed to proceed.  History of Present Illness:  Chief Complaint  Patient presents with  . Herpes Zoster    possible shingles-rash on face, left side of face-under nose near lip and left side of nose. Mild swelling in her face that is painful. Whole left side of face is sensitive. Weird feeling started Sunday and blister started.    Sunday she noted a "funny sensation" above her lift, felt sensitive to touch (like a zit or a splinter), but didn't see anything. Monday she noticed a blistery-bump.  Noticed it on her nose on Tuesday, just a small bump, but is getting worse each day.  She has pain in her face, left side is a little puffy.  Left eye is just slightly watery, no eye pain, no change in vision.  Ibuprofen helps with the pain. Took benadryl yesterday and this morning.  PMH, PSH, SH reviewed  Outpatient Encounter Medications as of 12/28/2019  Medication Sig  . amLODipine (NORVASC) 5 MG tablet Take 1 tablet (5 mg total) by mouth daily.  . Ascorbic Acid (VITAMIN C) 1000 MG tablet Take 1,000 mg by mouth daily.  . Cholecalciferol (VITAMIN D3) 50 MCG (2000 UT) TABS Take 2 tablets by mouth daily.  Marland Kitchen esomeprazole (NEXIUM) 20 MG capsule Take 20 mg daily at 12 noon by mouth.  . hydrochlorothiazide (HYDRODIURIL) 25 MG tablet Take 1 tablet (25 mg total) by mouth daily.  . potassium chloride (KLOR-CON) 10 MEQ tablet Take 10 mEq by mouth daily.   No facility-administered encounter medications on file as of 12/28/2019.   Allergies   Allergen Reactions  . Sulfonamide Derivatives Rash   ROS: no fever, chills, URI symptoms, cough, shortness of breath.  Rash on face per HPI. No other complaints.  Observations/Objective:  Temp 98 F (36.7 C) (Other (Comment))   Ht 5\' 5"  (1.651 m)   Wt 230 lb (104.3 kg)   LMP 12/02/2019 (Approximate)   BMI 38.27 kg/m    Well-appearing, pleasant female, in good spirits. She sent photos through [place]-- Also seen briefly on video (had to change to audio only/telephone due to her not being able to hear me well)  There are clusters of vesicles, some of which are crusted over, at the left side of the nose and the left nasolabial fold.  There is some erythema, crusting, and some slight soft tissue swelling.  Eye appears normal.  Tip of nose not affected, just the side.  Forehead appears normal.    Assessment and Plan:   Herpes zoster without complication - Plan: valACYclovir (VALTREX) 1000 MG tablet, mupirocin ointment (BACTROBAN) 2 %  Treat shingles with Valtrex TID x 7 days. If any ocular symptoms develop, needs to see ophtho. Given the crusting and soft tissue swelling, will also rx bactroban. F/u in office if symptoms persist/worsen. Declines pain meds, as ibuprofen has been effective. Discussed she can contact me if that changes, and we can send in tramadol.  Counseled re: expected course, and to avoid others that are at risk for varicella until crusted over.   Follow Up Instructions:  I discussed the assessment and treatment plan with the patient. The patient was provided an opportunity to ask questions and all were answered. The patient agreed with the plan and demonstrated an understanding of the instructions.   The patient was advised to call back or seek an in-person evaluation if the symptoms worsen or if the condition fails to improve as anticipated.  I provided 18 minutes of non-face-to-face time during this encounter.   Vikki Ports, MD

## 2019-12-28 NOTE — Patient Instructions (Addendum)
You may use up to 4 ibuprofen every 8 hours (three times daily), with food, if needed for pain. Cut back on the dose if it bothers your stomach. You may use tylenol along with the ibuprofen, if needed for pain.  If you notice any eye pain, crusting, vision changes, please see the eye doctor right away.  Take the valtrex three times daily for 7 days.  Contact us if your pain isn't adequately controlled with the above measures.    Shingles  Shingles, which is also known as herpes zoster, is an infection that causes a painful skin rash and fluid-filled blisters. It is caused by a virus. Shingles only develops in people who:  Have had chickenpox.  Have been given a medicine to protect against chickenpox (have been vaccinated). Shingles is rare in this group. What are the causes? Shingles is caused by varicella-zoster virus (VZV). This is the same virus that causes chickenpox. After a person is exposed to VZV, the virus stays in the body in an inactive (dormant) state. Shingles develops if the virus is reactivated. This can happen many years after the first (initial) exposure to VZV. It is not known what causes this virus to be reactivated. What increases the risk? People who have had chickenpox or received the chickenpox vaccine are at risk for shingles. Shingles infection is more common in people who:  Are older than age 48.  Have a weakened disease-fighting system (immune system), such as people with: ? HIV. ? AIDS. ? Cancer.  Are taking medicines that weaken the immune system, such as transplant medicines.  Are experiencing a lot of stress. What are the signs or symptoms? Early symptoms of this condition include itching, tingling, and pain in an area on your skin. Pain may be described as burning, stabbing, or throbbing. A few days or weeks after early symptoms start, a painful red rash appears. The rash is usually on one side of the body and has a band-like or belt-like pattern.  The rash eventually turns into fluid-filled blisters that break open, change into scabs, and dry up in about 2-3 weeks. At any time during the infection, you may also develop:  A fever.  Chills.  A headache.  An upset stomach. How is this diagnosed? This condition is diagnosed with a skin exam. Skin or fluid samples may be taken from the blisters before a diagnosis is made. These samples are examined under a microscope or sent to a lab for testing. How is this treated? The rash may last for several weeks. There is not a specific cure for this condition. Your health care provider will probably prescribe medicines to help you manage pain, recover more quickly, and avoid long-term problems. Medicines may include:  Antiviral drugs.  Anti-inflammatory drugs.  Pain medicines.  Anti-itching medicines (antihistamines). If the area involved is on your face, you may be referred to a specialist, such as an eye doctor (ophthalmologist) or an ear, nose, and throat (ENT) doctor (otolaryngologist) to help you avoid eye problems, chronic pain, or disability. Follow these instructions at home: Medicines  Take over-the-counter and prescription medicines only as told by your health care provider.  Apply an anti-itch cream or numbing cream to the affected area as told by your health care provider. Relieving itching and discomfort   Apply cold, wet cloths (cold compresses) to the area of the rash or blisters as told by your health care provider.  Cool baths can be soothing. Try adding baking soda or dry oatmeal  to the water to reduce itching. Do not bathe in hot water. Blister and rash care  Keep your rash covered with a loose bandage (dressing). Wear loose-fitting clothing to help ease the pain of material rubbing against the rash.  Keep your rash and blisters clean by washing the area with mild soap and cool water as told by your health care provider.  Check your rash every day for signs of  infection. Check for: ? More redness, swelling, or pain. ? Fluid or blood. ? Warmth. ? Pus or a bad smell.  Do not scratch your rash or pick at your blisters. To help avoid scratching: ? Keep your fingernails clean and cut short. ? Wear gloves or mittens while you sleep, if scratching is a problem. General instructions  Rest as told by your health care provider.  Keep all follow-up visits as told by your health care provider. This is important.  Wash your hands often with soap and water. If soap and water are not available, use hand sanitizer. Doing this lowers your chance of getting a bacterial skin infection.  Before your blisters change into scabs, your shingles infection can cause chickenpox in people who have never had it or have never been vaccinated against it. To prevent this from happening, avoid contact with other people, especially: ? Babies. ? Pregnant women. ? Children who have eczema. ? Elderly people who have transplants. ? People who have chronic illnesses, such as cancer or AIDS. Contact a health care provider if:  Your pain is not relieved with prescribed medicines.  Your pain does not get better after the rash heals.  You have signs of infection in the rash area, such as: ? More redness, swelling, or pain around the rash. ? Fluid or blood coming from the rash. ? The rash area feeling warm to the touch. ? Pus or a bad smell coming from the rash. Get help right away if:  The rash is on your face or nose.  You have facial pain, pain around your eye area, or loss of feeling on one side of your face.  You have difficulty seeing.  You have ear pain or have ringing in your ear.  You have a loss of taste.  Your condition gets worse. Summary  Shingles, which is also known as herpes zoster, is an infection that causes a painful skin rash and fluid-filled blisters.  This condition is diagnosed with a skin exam. Skin or fluid samples may be taken from the  blisters and examined before the diagnosis is made.  Keep your rash covered with a loose bandage (dressing). Wear loose-fitting clothing to help ease the pain of material rubbing against the rash.  Before your blisters change into scabs, your shingles infection can cause chickenpox in people who have never had it or have never been vaccinated against it. This information is not intended to replace advice given to you by your health care provider. Make sure you discuss any questions you have with your health care provider. Document Revised: 04/07/2019 Document Reviewed: 08/18/2017 Elsevier Patient Education  2020 [employer].

## 2019-12-29 DIAGNOSIS — B029 Zoster without complications: Secondary | ICD-10-CM

## 2019-12-29 HISTORY — DX: Zoster without complications: B02.9

## 2020-01-30 ENCOUNTER — Ambulatory Visit: Payer: 59 | Attending: Internal Medicine

## 2020-01-30 DIAGNOSIS — Z20822 Contact with and (suspected) exposure to covid-19: Secondary | ICD-10-CM | POA: Insufficient documentation

## 2020-01-31 LAB — NOVEL CORONAVIRUS, NAA: SARS-CoV-2, NAA: NOT DETECTED

## 2020-02-08 ENCOUNTER — Other Ambulatory Visit: Payer: Self-pay | Admitting: Obstetrics and Gynecology

## 2020-02-08 DIAGNOSIS — R928 Other abnormal and inconclusive findings on diagnostic imaging of breast: Secondary | ICD-10-CM

## 2020-02-08 LAB — HM PAP SMEAR: HM Pap smear: NEGATIVE

## 2020-02-21 ENCOUNTER — Other Ambulatory Visit: Payer: Self-pay | Admitting: Obstetrics and Gynecology

## 2020-02-21 ENCOUNTER — Ambulatory Visit
Admission: RE | Admit: 2020-02-21 | Discharge: 2020-02-21 | Disposition: A | Discharge status: Home / Self Care | Payer: 59 | Source: Ambulatory Visit | Attending: Obstetrics and Gynecology | Admitting: Obstetrics and Gynecology

## 2020-02-21 ENCOUNTER — Other Ambulatory Visit: Payer: Self-pay

## 2020-02-21 DIAGNOSIS — R928 Other abnormal and inconclusive findings on diagnostic imaging of breast: Secondary | ICD-10-CM

## 2020-03-08 ENCOUNTER — Ambulatory Visit: Payer: 59 | Attending: Internal Medicine

## 2020-03-08 DIAGNOSIS — Z20822 Contact with and (suspected) exposure to covid-19: Secondary | ICD-10-CM

## 2020-03-09 LAB — NOVEL CORONAVIRUS, NAA: SARS-CoV-2, NAA: NOT DETECTED

## 2020-08-14 ENCOUNTER — Other Ambulatory Visit: Payer: Self-pay

## 2020-08-14 ENCOUNTER — Other Ambulatory Visit: Payer: 59

## 2020-08-14 DIAGNOSIS — Z20822 Contact with and (suspected) exposure to covid-19: Secondary | ICD-10-CM

## 2020-08-15 LAB — SARS-COV-2, NAA 2 DAY TAT

## 2020-08-15 LAB — NOVEL CORONAVIRUS, NAA: SARS-CoV-2, NAA: NOT DETECTED

## 2020-08-19 ENCOUNTER — Other Ambulatory Visit: Payer: Self-pay

## 2020-08-19 ENCOUNTER — Ambulatory Visit: Payer: 59 | Admitting: Medical

## 2020-08-19 ENCOUNTER — Encounter: Payer: Self-pay | Admitting: Medical

## 2020-08-19 VITALS — BP 150/98 | HR 77 | Ht 65.0 in | Wt 241.2 lb

## 2020-08-19 DIAGNOSIS — Z7189 Other specified counseling: Secondary | ICD-10-CM

## 2020-08-19 DIAGNOSIS — L84 Corns and callosities: Secondary | ICD-10-CM

## 2020-08-19 DIAGNOSIS — Z0000 Encounter for general adult medical examination without abnormal findings: Secondary | ICD-10-CM | POA: Diagnosis not present

## 2020-08-19 DIAGNOSIS — Z131 Encounter for screening for diabetes mellitus: Secondary | ICD-10-CM

## 2020-08-19 DIAGNOSIS — K219 Gastro-esophageal reflux disease without esophagitis: Secondary | ICD-10-CM

## 2020-08-19 DIAGNOSIS — Z1211 Encounter for screening for malignant neoplasm of colon: Secondary | ICD-10-CM

## 2020-08-19 DIAGNOSIS — F321 Major depressive disorder, single episode, moderate: Secondary | ICD-10-CM

## 2020-08-19 DIAGNOSIS — R7301 Impaired fasting glucose: Secondary | ICD-10-CM

## 2020-08-19 DIAGNOSIS — Z13220 Encounter for screening for lipoid disorders: Secondary | ICD-10-CM

## 2020-08-19 DIAGNOSIS — E66813 Obesity, class 3: Secondary | ICD-10-CM

## 2020-08-19 DIAGNOSIS — E876 Hypokalemia: Secondary | ICD-10-CM

## 2020-08-19 DIAGNOSIS — N951 Menopausal and female climacteric states: Secondary | ICD-10-CM

## 2020-08-19 DIAGNOSIS — E559 Vitamin D deficiency, unspecified: Secondary | ICD-10-CM

## 2020-08-19 DIAGNOSIS — Z7185 Encounter for immunization safety counseling: Secondary | ICD-10-CM

## 2020-08-19 DIAGNOSIS — R748 Abnormal levels of other serum enzymes: Secondary | ICD-10-CM | POA: Diagnosis not present

## 2020-08-19 DIAGNOSIS — Z6841 Body Mass Index (BMI) 40.0 and over, adult: Secondary | ICD-10-CM

## 2020-08-19 DIAGNOSIS — I10 Essential (primary) hypertension: Secondary | ICD-10-CM

## 2020-08-19 MED ORDER — HYDROCHLOROTHIAZIDE 25 MG PO TABS: 25.0000 mg | ORAL_TABLET | Freq: Every day | ORAL | 0 refills | Status: DC

## 2020-08-19 MED ORDER — PAROXETINE HCL 20 MG PO TABS: 20.0000 mg | ORAL_TABLET | Freq: Every day | ORAL | 1 refills | Status: DC

## 2020-08-19 MED ORDER — POTASSIUM CHLORIDE CRYS ER 10 MEQ PO TBCR: 10.0000 meq | EXTENDED_RELEASE_TABLET | Freq: Every day | ORAL | 0 refills | Status: DC

## 2020-08-19 MED ORDER — AMLODIPINE BESYLATE 5 MG PO TABS: 5.0000 mg | ORAL_TABLET | Freq: Every day | ORAL | 0 refills | Status: DC

## 2020-08-19 NOTE — Progress Notes (Signed)
Subjective:  Jennifer Leonard is a 49 y.o. female who presents for Chief Complaint  Patient presents with  . Annual Exam    not fasting      Here for depressed mood.  Dealt with depression in past, maybe 20 years ago, but hasn't felt as bad as this in a while.  She notes multiple things triggering her mood change in the last several months.  The Covid pandemic has been very difficult.  She was already working from home prior to the pandemic and being isolated at home all the time is made things a lot harder as she feels like she needs to escape her house to have social contact.  That has not been possible in the last year.  She also continues to be guarding over her grandson.  Although she enjoys having him around and he is what drives her, it is still difficult to parent.  For example today being the first day of school she has already gotten a call from the school about him being disrespectful in class to the teachers.  Her mother passed away Feb 12, 2020.  Her long-term partner 20 years has numerous health issues that are severe, and this takes a toll on the day-to-day coping.  She has thought at times that maybe it would be better off if she was not here and her family could not get her insurance money and be financially well off.  However she has not made a plan and she says she would actually not go through with harming herself.  She hasn't talked to a counselor yet.  Her sister is doing tele visits with counseling and says it works out okay.  Jennifer Leonard has been on Paxil in the remote past and did okay on this.  She did not do well on BuSpar in the past.  She also wonders if some of her symptoms are menopause related.  She is still getting monthly periods but they only last about a day or so and are much lighter than in the past.  She saw gynecology earlier this year but they did not do any type of lab testing for menopause.  She requests this today.  She also wonders about any other underlying medical  problems causing her to be fatigued or having these mood changes.  Another stressor is that her cousin who is similar in age to Jennifer Leonard got diagnosed with stage IV melanoma.  She is working couple different part-time jobs currently which gets her at the house.  And that aspect that is good but she is trying to get a more permanent full-time job and is not self-employed as this has been stressful as well.  She ran out of her one of her blood pressure medicines recently.  She is compliant with her medications in general  No other aggravating or relieving factors.    No other c/o.  Past Medical History:  Diagnosis Date  . Anxiety   . History of mammogram    through gyn  . Hypertension   . Routine gynecological examination    Dr. Helane Rima  . Wears glasses    astigmatism    Past Surgical History:  Procedure Laterality Date  . CHOLECYSTECTOMY    . COLONOSCOPY  2011   benign polyp, Dr. Kennedy Bucker  . WISDOM TOOTH EXTRACTION      Family History  Problem Relation Age of Onset  . Diabetes Mother   . Hypertension Mother   . Depression Mother   .  Anxiety disorder Mother   . Hypertension Father   . Hyperlipidemia Father   . Depression Father   . Seizures Sister   . Depression Sister   . Cancer Maternal Aunt        breast  . Heart disease Maternal Grandmother        valve disease  . Cancer Paternal Grandmother        breast  . Cancer Paternal Grandfather        lung  . Cancer Paternal Uncle        melanoma  . Heart disease Maternal Grandfather 90       MI  . Stroke Neg Hx      Current Outpatient Medications:  .  amLODipine (NORVASC) 5 MG tablet, Take 1 tablet (5 mg total) by mouth daily., Disp: 90 tablet, Rfl: 0 .  Ascorbic Acid (VITAMIN C) 1000 MG tablet, Take 1,000 mg by mouth daily., Disp: , Rfl:  .  Cholecalciferol (VITAMIN D3) 50 MCG (2000 UT) TABS, Take 2 tablets by mouth daily., Disp: 180 tablet, Rfl: 3 .  esomeprazole (NEXIUM) 20 MG capsule, Take 20 mg daily at  12 noon by mouth., Disp: , Rfl:  .  hydrochlorothiazide (HYDRODIURIL) 25 MG tablet, Take 1 tablet (25 mg total) by mouth daily., Disp: 90 tablet, Rfl: 0 .  mupirocin ointment (BACTROBAN) 2 %, Apply 1 application topically 2 (two) times daily. (Patient not taking: Reported on 08/19/2020), Disp: 30 g, Rfl: 0 .  PARoxetine (PAXIL) 20 MG tablet, Take 1 tablet (20 mg total) by mouth daily., Disp: 30 tablet, Rfl: 1 .  potassium chloride (KLOR-CON) 10 MEQ tablet, Take 1 tablet (10 mEq total) by mouth daily., Disp: 90 tablet, Rfl: 0 .  valACYclovir (VALTREX) 1000 MG tablet, Take 1 tablet (1,000 mg total) by mouth 3 (three) times daily. (Patient not taking: Reported on 08/19/2020), Disp: 21 tablet, Rfl: 0  Allergies  Allergen Reactions  . Sulfonamide Derivatives Rash      The following portions of the patient's history were reviewed and updated as appropriate: allergies, current medications, past family history, past medical history, past social history, past surgical history and problem list.  ROS Otherwise as in subjective above   Objective: BP (!) 150/98   Pulse 77   Ht 5\' 5"  (1.651 m)   Wt 241 lb 3.2 oz (109.4 kg)   SpO2 97%   BMI 40.14 kg/m   Wt Readings from Last 3 Encounters:  08/19/20 241 lb 3.2 oz (109.4 kg)  12/28/19 230 lb (104.3 kg)  05/31/19 235 lb 9.6 oz (106.9 kg)   BP Readings from Last 3 Encounters:  08/19/20 (!) 150/98  05/31/19 136/80  03/07/18 132/80    General appearance: alert, no distress, well developed, well nourished, white female Neck: supple, no lymphadenopathy, no thyromegaly, no masses Heart: RRR, normal S1, S2, no murmurs Lungs: CTA bilaterally, no wheezes, rhonchi, or rales Abdomen: +bs, soft, non tender, non distended, no masses, no hepatomegaly, no splenomegaly Pulses: 2+ radial pulses, 2+ pedal pulses, normal cap refill Ext: no edema Psych: Pleasant, tearful at times, depressed mood Breast, GYN, rectal-deferred to gynecology Skin: Left lateral  fifth toe along the distal metatarsal with a 3 mm round tender area consistent with a corn Otherwise foot exam unremarkable   Assessment: Encounter Diagnoses  Name Primary?  . Encounter for health maintenance examination in adult Yes  . Depression, major, single episode, moderate ([place])   . Class 3 severe obesity with serious comorbidity and body  mass index (BMI) of 40.0 to 44.9 in adult, unspecified obesity type ([place])   . Alkaline phosphatase elevation   . Hypokalemia   . Vitamin D deficiency   . Essential hypertension   . Gastroesophageal reflux disease without esophagitis   . Impaired fasting glucose   . Perimenopausal   . Vaccine counseling   . Screen for colon cancer   . Screening for diabetes mellitus   . Screening for lipid disorders      Plan: Physical exam - discussed and counseled on healthy lifestyle, diet, exercise, preventative care, vaccinations, sick and well care, proper use of emergency dept and after hours care, and addressed their concerns.    Health screening: See your eye doctor yearly for routine vision care. See your dentist yearly for routine dental care including hygiene visits twice yearly. See your gynecologist yearly for routine gynecological care.  Cancer screening She notes mammogram and Pap smear up-to-date through gynecology.  She is actually scheduled to do additional breast imaging.  She knows she needs a colonoscopy but has not done this yet due to lack of insurance in recent years   Vaccinations: Advised yearly influenza vaccine She is up-to-date on tetanus vaccine.   Acute issues discussed: Depression-advised counseling, she contracts for safety, begin Paxil, discussed risk and benefits of medication.  Follow-up in 3 to 4 weeks   Separate significant chronic issues discussed: Hypertension-continue current medication, refills sent  Continue vitamin D supplement  Perimenopausal symptoms-labs today    Jaisha was seen today for  annual exam.  Diagnoses and all orders for this visit:  Encounter for health maintenance examination in adult -     Comprehensive metabolic panel; Future -     CBC with Differential/Platelet; Future -     VITAMIN D 25 Hydroxy (Vit-D Deficiency, Fractures); Future -     Lipid panel; Future -     Follicle stimulating hormone; Future -     Ambulatory referral to Gastroenterology -     TSH; Future -     Hemoglobin A1c; Future  Depression, major, single episode, moderate ([facility])  Class 3 severe obesity with serious comorbidity and body mass index (BMI) of 40.0 to 44.9 in adult, unspecified obesity type ([facility])  Alkaline phosphatase elevation  Hypokalemia  Vitamin D deficiency -     VITAMIN D 25 Hydroxy (Vit-D Deficiency, Fractures); Future  Essential hypertension  Gastroesophageal reflux disease without esophagitis  Impaired fasting glucose -     Hemoglobin A1c; Future  Perimenopausal -     Follicle stimulating hormone; Future  Vaccine counseling  Screen for colon cancer  Screening for diabetes mellitus -     Hemoglobin A1c; Future  Screening for lipid disorders -     Lipid panel; Future  Other orders -     amLODipine (NORVASC) 5 MG tablet; Take 1 tablet (5 mg total) by mouth daily. -     hydrochlorothiazide (HYDRODIURIL) 25 MG tablet; Take 1 tablet (25 mg total) by mouth daily. -     potassium chloride (KLOR-CON) 10 MEQ tablet; Take 1 tablet (10 mEq total) by mouth daily. -     PARoxetine (PAXIL) 20 MG tablet; Take 1 tablet (20 mg total) by mouth daily.    Follow up: fasting labs, 2-3 wk on medication, mood

## 2020-08-19 NOTE — Patient Instructions (Signed)
RESOURCES in [place], [place]  If you are experiencing a mental health crisis or an emergency, please call 911 or go to the nearest emergency department.   Hospital   [phone] [facility]  [phone] [facility]   [phone]  Suicide Hotline 1-800-Suicide (1-800-784-2433)  National Suicide Prevention Lifeline 1-800-273-TALK  (1-800-273-8255)  Domestic Violence, Rape/Crisis - [facility] [phone]  The National Domestic Violence Hotline 1-800-799-SAFE (1-800-799-7233)  To report Child or Elder Abuse, please call: [place] Police Department  [phone] [place] [employer]  [phone]  Teen Crisis line [phone] or 1-877-332-7333     Psychiatry and Counseling services  [facility] [address], [place], [place] [address] [phone]  Dr. Carey Cottle, psychiatrist Dr. Glenn Jennings, child psychiatrist   Dr. Libi Corso Fuller Child and teen psychiatrist [address], [place], [place] [address] [phone]   Dr. Rupinder Kaur, psychiatry [address], [place], [place] [address] [phone]    [facility] Behavioral Health Crisis Line and Main phone number [phone]  Behavioral Health Urgent Care  [phone]  Behavioral Health Outpatient Clinic [phone]  Adult Crisis Center [phone]   [facility] [address], [place], [place] [address] [phone]    Counseling Services (NON- psychiatrist offices)  Dr. Claude Ragan, Holly Haulter [phone] [address] [place], [place] [address]   [facility] [address], [place], [place] [address] [phone]   [facility] [phone] [address], [place], [place] [address]   [facility] [phone]  [email or web address] [address], [place], [place] [address]   Merrianne M. Leff, therapist [phone] [address] [place], [place] [address]   [facility] [phone] [address], [place], [place] [address]   Jill White-Huffman, therapist [phone] [address], [place], [place] [address]   [facility] [phone] [address], [place], [place] [address]   [facility] [phone] office [address] [place], [place] [address] Crisis services, Family support, in home therapy, treatment for Anxiety, PTSD, Sexual Assault, Substance Abuse, Financial/Credit Counseling, Variety of other services   

## 2020-08-21 ENCOUNTER — Ambulatory Visit
Admission: RE | Admit: 2020-08-21 | Discharge: 2020-08-21 | Disposition: A | Discharge status: Home / Self Care | Payer: 59 | Source: Ambulatory Visit | Attending: Obstetrics and Gynecology | Admitting: Obstetrics and Gynecology

## 2020-08-21 ENCOUNTER — Other Ambulatory Visit: Payer: Self-pay | Admitting: Obstetrics and Gynecology

## 2020-08-21 ENCOUNTER — Other Ambulatory Visit: Payer: 59

## 2020-08-21 ENCOUNTER — Other Ambulatory Visit: Payer: Self-pay

## 2020-08-21 DIAGNOSIS — Z0000 Encounter for general adult medical examination without abnormal findings: Secondary | ICD-10-CM

## 2020-08-21 DIAGNOSIS — E559 Vitamin D deficiency, unspecified: Secondary | ICD-10-CM

## 2020-08-21 DIAGNOSIS — R928 Other abnormal and inconclusive findings on diagnostic imaging of breast: Secondary | ICD-10-CM

## 2020-08-21 DIAGNOSIS — R7301 Impaired fasting glucose: Secondary | ICD-10-CM

## 2020-08-21 DIAGNOSIS — N6320 Unspecified lump in the left breast, unspecified quadrant: Secondary | ICD-10-CM

## 2020-08-21 DIAGNOSIS — N951 Menopausal and female climacteric states: Secondary | ICD-10-CM

## 2020-08-21 DIAGNOSIS — Z13220 Encounter for screening for lipoid disorders: Secondary | ICD-10-CM

## 2020-08-21 DIAGNOSIS — Z131 Encounter for screening for diabetes mellitus: Secondary | ICD-10-CM

## 2020-08-22 ENCOUNTER — Other Ambulatory Visit: Payer: Self-pay | Admitting: Medical

## 2020-08-22 LAB — CBC WITH DIFFERENTIAL/PLATELET
Basophils Absolute: 0.1 10*3/uL (ref 0.0–0.2)
Basos: 1 %
EOS (ABSOLUTE): 0.3 10*3/uL (ref 0.0–0.4)
Eos: 3 %
Hematocrit: 36.7 % (ref 34.0–46.6)
Hemoglobin: 12.2 g/dL (ref 11.1–15.9)
Immature Grans (Abs): 0 10*3/uL (ref 0.0–0.1)
Immature Granulocytes: 0 %
Lymphocytes Absolute: 2.3 10*3/uL (ref 0.7–3.1)
Lymphs: 25 %
MCH: 27.5 pg (ref 26.6–33.0)
MCHC: 33.2 g/dL (ref 31.5–35.7)
MCV: 83 fL (ref 79–97)
Monocytes Absolute: 0.5 10*3/uL (ref 0.1–0.9)
Monocytes: 6 %
Neutrophils Absolute: 6.1 10*3/uL (ref 1.4–7.0)
Neutrophils: 65 %
Platelets: 278 10*3/uL (ref 150–450)
RBC: 4.43 x10E6/uL (ref 3.77–5.28)
RDW: 13.3 % (ref 11.7–15.4)
WBC: 9.3 10*3/uL (ref 3.4–10.8)

## 2020-08-22 LAB — COMPREHENSIVE METABOLIC PANEL
ALT: 11 IU/L (ref 0–32)
AST: 13 IU/L (ref 0–40)
Albumin/Globulin Ratio: 1.4 (ref 1.2–2.2)
Albumin: 3.9 g/dL (ref 3.8–4.8)
Alkaline Phosphatase: 121 IU/L (ref 48–121)
BUN/Creatinine Ratio: 11 (ref 9–23)
BUN: 9 mg/dL (ref 6–24)
Bilirubin Total: 0.3 mg/dL (ref 0.0–1.2)
CO2: 24 mmol/L (ref 20–29)
Calcium: 9.4 mg/dL (ref 8.7–10.2)
Chloride: 102 mmol/L (ref 96–106)
Creatinine, Ser: 0.82 mg/dL (ref 0.57–1.00)
GFR calc Af Amer: 97 mL/min/{1.73_m2} (ref >59)
GFR calc non Af Amer: 84 mL/min/{1.73_m2} (ref >59)
Globulin, Total: 2.7 g/dL (ref 1.5–4.5)
Glucose: 101 mg/dL — ABNORMAL HIGH (ref 65–99)
Potassium: 3.9 mmol/L (ref 3.5–5.2)
Sodium: 141 mmol/L (ref 134–144)
Total Protein: 6.6 g/dL (ref 6.0–8.5)

## 2020-08-22 LAB — LIPID PANEL
Chol/HDL Ratio: 3.3 ratio (ref 0.0–4.4)
Cholesterol, Total: 186 mg/dL (ref 100–199)
HDL: 56 mg/dL (ref >39)
LDL Chol Calc (NIH): 102 mg/dL — ABNORMAL HIGH (ref 0–99)
Triglycerides: 162 mg/dL — ABNORMAL HIGH (ref 0–149)
VLDL Cholesterol Cal: 28 mg/dL (ref 5–40)

## 2020-08-22 LAB — TSH: TSH: 2.52 u[IU]/mL (ref 0.450–4.500)

## 2020-08-22 LAB — FOLLICLE STIMULATING HORMONE: FSH: 2.2 m[IU]/mL

## 2020-08-22 LAB — VITAMIN D 25 HYDROXY (VIT D DEFICIENCY, FRACTURES): Vit D, 25-Hydroxy: 25.3 ng/mL — ABNORMAL LOW (ref 30.0–100.0)

## 2020-08-22 LAB — HEMOGLOBIN A1C
Est. average glucose Bld gHb Est-mCnc: 131 mg/dL
Hgb A1c MFr Bld: 6.2 % — ABNORMAL HIGH (ref 4.8–5.6)

## 2020-08-22 MED ORDER — AMLODIPINE BESYLATE 10 MG PO TABS: 10.0000 mg | ORAL_TABLET | Freq: Every day | ORAL | 3 refills | Status: DC

## 2020-08-22 MED ORDER — HYDROCHLOROTHIAZIDE 25 MG PO TABS: 25.0000 mg | ORAL_TABLET | Freq: Every day | ORAL | 0 refills | Status: DC

## 2020-08-22 MED ORDER — VITAMIN D (ERGOCALCIFEROL) 1.25 MG (50000 UNIT) PO CAPS: 50000.0000 [IU] | ORAL_CAPSULE | ORAL | 3 refills | Status: DC

## 2020-11-15 ENCOUNTER — Other Ambulatory Visit: Payer: 59

## 2020-11-15 DIAGNOSIS — Z20822 Contact with and (suspected) exposure to covid-19: Secondary | ICD-10-CM

## 2020-11-17 LAB — NOVEL CORONAVIRUS, NAA: SARS-CoV-2, NAA: NOT DETECTED

## 2020-11-17 LAB — SARS-COV-2, NAA 2 DAY TAT

## 2020-12-25 ENCOUNTER — Other Ambulatory Visit: Payer: Self-pay

## 2020-12-25 ENCOUNTER — Encounter: Payer: Self-pay | Admitting: Medical

## 2020-12-25 ENCOUNTER — Ambulatory Visit (INDEPENDENT_AMBULATORY_CARE_PROVIDER_SITE_OTHER): Payer: 59 | Admitting: Family Medicine

## 2020-12-25 VITALS — BP 120/80 | HR 66 | Wt 239.2 lb

## 2020-12-25 DIAGNOSIS — Z8249 Family history of ischemic heart disease and other diseases of the circulatory system: Secondary | ICD-10-CM | POA: Diagnosis not present

## 2020-12-25 DIAGNOSIS — R0789 Other chest pain: Secondary | ICD-10-CM | POA: Diagnosis not present

## 2020-12-25 NOTE — Patient Instructions (Signed)
Pay attention to the symptoms in terms of what, when, where, why

## 2020-12-25 NOTE — Progress Notes (Signed)
   Subjective:    Patient ID: Jennifer Leonard, female    DOB: 1971-04-29, 49 y.o.   MRN: [id number]  HPI She complains of a 2-week history of intermittent chest "vibrations" occurs in the right sternal area.  She was able to check her pulse and found it to be normal.  There is no associated chest pain, weakness, diaphoresis.  She does not have a family history of heart disease with her mother dying of a heart attack at age 78.  She has had recent blood work.  She does not smoke.  Plans to get involved in an exercise program.  She has had a mammogram.   Review of Systems     Objective:   Physical Exam Alert and in no distress. Tympanic membranes and canals are normal. Pharyngeal area is normal. Neck is supple without adenopathy or thyromegaly. Cardiac exam shows a regular sinus rhythm without murmurs or gallops. Lungs are clear to auscultation. EKG read by me shows a sinus rhythm of 60 with other parameters being normal ;read as borderline when viewing the QRS complexes.  Blood work was reviewed and does show an LDL slightly above 100.    Assessment & Plan:  Chest wall discomfort  Family history of heart disease in female family member before age 6 I explained that at this time, her discomfort is unclear.  She is to pay attention to when this occurs again, where, when and why and keep [place] informed.

## 2021-01-07 ENCOUNTER — Other Ambulatory Visit: Payer: Self-pay

## 2021-01-07 DIAGNOSIS — Z20822 Contact with and (suspected) exposure to covid-19: Secondary | ICD-10-CM | POA: Diagnosis not present

## 2021-01-08 LAB — SARS-COV-2, NAA 2 DAY TAT

## 2021-01-08 LAB — NOVEL CORONAVIRUS, NAA: SARS-CoV-2, NAA: DETECTED — AB

## 2021-02-24 ENCOUNTER — Other Ambulatory Visit: Payer: 59

## 2021-04-07 ENCOUNTER — Other Ambulatory Visit: Payer: Self-pay | Admitting: Obstetrics and Gynecology

## 2021-04-07 ENCOUNTER — Other Ambulatory Visit: Payer: Self-pay

## 2021-04-07 ENCOUNTER — Ambulatory Visit
Admission: RE | Admit: 2021-04-07 | Discharge: 2021-04-07 | Disposition: A | Discharge status: Home / Self Care | Payer: Self-pay | Source: Ambulatory Visit | Attending: Obstetrics and Gynecology | Admitting: Obstetrics and Gynecology

## 2021-04-07 ENCOUNTER — Ambulatory Visit
Admission: RE | Admit: 2021-04-07 | Discharge: 2021-04-07 | Disposition: A | Discharge status: Home / Self Care | Payer: BC Managed Care – PPO | Source: Ambulatory Visit | Attending: Obstetrics and Gynecology | Admitting: Obstetrics and Gynecology

## 2021-04-07 DIAGNOSIS — N6320 Unspecified lump in the left breast, unspecified quadrant: Secondary | ICD-10-CM

## 2021-04-07 DIAGNOSIS — N6489 Other specified disorders of breast: Secondary | ICD-10-CM | POA: Diagnosis not present

## 2021-04-07 DIAGNOSIS — R922 Inconclusive mammogram: Secondary | ICD-10-CM | POA: Diagnosis not present

## 2021-04-07 LAB — HM MAMMOGRAPHY

## 2021-04-11 ENCOUNTER — Ambulatory Visit
Admission: RE | Admit: 2021-04-11 | Discharge: 2021-04-11 | Disposition: A | Discharge status: Home / Self Care | Payer: BC Managed Care – PPO | Source: Ambulatory Visit | Attending: Obstetrics and Gynecology | Admitting: Obstetrics and Gynecology

## 2021-04-11 ENCOUNTER — Other Ambulatory Visit: Payer: Self-pay | Admitting: Obstetrics and Gynecology

## 2021-04-11 ENCOUNTER — Other Ambulatory Visit: Payer: Self-pay

## 2021-04-11 DIAGNOSIS — N6489 Other specified disorders of breast: Secondary | ICD-10-CM

## 2021-04-11 DIAGNOSIS — N62 Hypertrophy of breast: Secondary | ICD-10-CM | POA: Diagnosis not present

## 2021-04-11 DIAGNOSIS — R928 Other abnormal and inconclusive findings on diagnostic imaging of breast: Secondary | ICD-10-CM | POA: Diagnosis not present

## 2021-05-07 ENCOUNTER — Other Ambulatory Visit: Payer: Self-pay | Admitting: Medical

## 2021-05-15 ENCOUNTER — Encounter: Payer: Self-pay | Admitting: Medical

## 2021-05-16 ENCOUNTER — Other Ambulatory Visit: Payer: Self-pay | Admitting: Medical

## 2021-05-20 DIAGNOSIS — Z01419 Encounter for gynecological examination (general) (routine) without abnormal findings: Secondary | ICD-10-CM | POA: Diagnosis not present

## 2021-05-20 DIAGNOSIS — Z6841 Body Mass Index (BMI) 40.0 and over, adult: Secondary | ICD-10-CM | POA: Diagnosis not present

## 2021-08-08 ENCOUNTER — Encounter: Payer: Self-pay | Admitting: Internal Medicine

## 2021-08-08 ENCOUNTER — Encounter: Payer: Self-pay | Admitting: Gastroenterology

## 2021-08-21 ENCOUNTER — Encounter: Payer: Self-pay | Admitting: Gastroenterology

## 2021-09-03 ENCOUNTER — Ambulatory Visit (AMBULATORY_SURGERY_CENTER): Payer: BC Managed Care – PPO | Admitting: *Deleted

## 2021-09-03 ENCOUNTER — Other Ambulatory Visit: Payer: Self-pay

## 2021-09-03 VITALS — Ht 65.0 in | Wt 230.0 lb

## 2021-09-03 DIAGNOSIS — Z1211 Encounter for screening for malignant neoplasm of colon: Secondary | ICD-10-CM

## 2021-09-03 MED ORDER — PEG-KCL-NACL-NASULF-NA ASC-C 100 G PO SOLR: 1.0000 | Freq: Once | ORAL | 0 refills | Status: AC

## 2021-09-03 NOTE — Progress Notes (Signed)
No egg or soy allergy known to patient  No issues with past sedation with any surgeries or procedures Patient denies ever being told they had issues or difficulty with intubation  No FH of Malignant Hyperthermia No diet pills per patient No home 02 use per patient  No blood thinners per patient  Pt denies issues with constipation  No A fib or A flutter  EMMI video to pt or via [place] 19 guidelines implemented in [place] today with Pt and RN   Pt is fully vaccinated  for Covid   MOVI  Coupon given to pt in PV today , Code to Pharmacy and  NO PA's for preps discussed with pt In PV today  Discussed with pt there will be an out-of-pocket cost for prep and that varies from $0 to 70 +  dollars   Due to the COVID-19 pandemic we are asking patients to follow certain guidelines.  Pt aware of COVID protocols and [facility] guidelines   Pt verified name, DOB, address and insurance during PV today.  Pt mailed instruction packet of Emmi video, copy of consent form to read and not return, and instructions. MOVI  coupon mailed in packet. PV completed over the phone.  Pt encouraged to call with questions or issues.  My Chart instructions to pt as well

## 2021-09-08 ENCOUNTER — Encounter: Payer: Self-pay | Admitting: Gastroenterology

## 2021-09-17 ENCOUNTER — Ambulatory Visit (AMBULATORY_SURGERY_CENTER): Payer: BC Managed Care – PPO | Admitting: Gastroenterology

## 2021-09-17 ENCOUNTER — Other Ambulatory Visit: Payer: Self-pay

## 2021-09-17 ENCOUNTER — Encounter: Payer: Self-pay | Admitting: Gastroenterology

## 2021-09-17 VITALS — BP 122/75 | HR 54 | Temp 97.1°F | Resp 20 | Ht 65.0 in | Wt 230.0 lb

## 2021-09-17 DIAGNOSIS — D125 Benign neoplasm of sigmoid colon: Secondary | ICD-10-CM

## 2021-09-17 DIAGNOSIS — K635 Polyp of colon: Secondary | ICD-10-CM | POA: Diagnosis not present

## 2021-09-17 DIAGNOSIS — Z1211 Encounter for screening for malignant neoplasm of colon: Secondary | ICD-10-CM

## 2021-09-17 MED ORDER — SODIUM CHLORIDE 0.9 % IV SOLN: 500.0000 mL | Freq: Once | INTRAVENOUS | Status: DC

## 2021-09-17 NOTE — Progress Notes (Signed)
History & Physical  Primary Care Physician:  Carlena Hurl, PA-C Primary Gastroenterologist: Jerilynn Mages. Fuller Plan, MD  CHIEF COMPLAINT:  CRC screening   HPI: Jennifer Leonard is a 50 y.o. female average risk for CRC who presents for colonoscopy.  No active gastrointestinal complaints.  Prior colonoscopy in 2011.   Past Medical History:  Diagnosis Date   Anxiety    GERD (gastroesophageal reflux disease)    History of mammogram    through gyn   Hypertension    Routine gynecological examination    Dr. Helane Rima   Wears glasses    astigmatism    Past Surgical History:  Procedure Laterality Date   BREAST BIOPSY     benign   CHOLECYSTECTOMY     COLONOSCOPY  2011   benign polyp, Dr. Kennedy Bucker   WISDOM TOOTH EXTRACTION      Prior to Admission medications   Medication Sig Start Date End Date Taking? Authorizing Provider  amLODipine (NORVASC) 10 MG tablet Take 1 tablet (10 mg total) by mouth daily. 08/22/20 09/17/21 Yes Tysinger, Camelia Eng, PA-C  esomeprazole (NEXIUM) 20 MG capsule Take 20 mg daily at 12 noon by mouth.   Yes [provider]  hydrochlorothiazide (HYDRODIURIL) 25 MG tablet Take 1 tablet (25 mg total) by mouth daily. 08/22/20  Yes Tysinger, Camelia Eng, PA-C  Vitamin D, Ergocalciferol, (DRISDOL) 1.25 MG (50000 UNIT) CAPS capsule Take 1 capsule (50,000 Units total) by mouth every 7 (seven) days. 08/22/20  Yes Tysinger, Camelia Eng, PA-C  Ascorbic Acid (VITAMIN C) 1000 MG tablet Take 1,000 mg by mouth daily. Patient not taking: No sig reported    [provider]  cholecalciferol (VITAMIN D3) 25 MCG (1000 UNIT) tablet Take 1,000 Units by mouth daily. Patient not taking: Reported on 09/17/2021    [provider]  potassium chloride (KLOR-CON) 10 MEQ tablet Take 1 tablet (10 mEq total) by mouth daily. 08/19/20   Tysinger, Camelia Eng, PA-C    Current Outpatient Medications  Medication Sig Dispense Refill   amLODipine (NORVASC) 10 MG tablet Take 1 tablet (10 mg  total) by mouth daily. 90 tablet 3   esomeprazole (NEXIUM) 20 MG capsule Take 20 mg daily at 12 noon by mouth.     hydrochlorothiazide (HYDRODIURIL) 25 MG tablet Take 1 tablet (25 mg total) by mouth daily. 90 tablet 0   Vitamin D, Ergocalciferol, (DRISDOL) 1.25 MG (50000 UNIT) CAPS capsule Take 1 capsule (50,000 Units total) by mouth every 7 (seven) days. 12 capsule 3   Ascorbic Acid (VITAMIN C) 1000 MG tablet Take 1,000 mg by mouth daily. (Patient not taking: No sig reported)     cholecalciferol (VITAMIN D3) 25 MCG (1000 UNIT) tablet Take 1,000 Units by mouth daily. (Patient not taking: Reported on 09/17/2021)     potassium chloride (KLOR-CON) 10 MEQ tablet Take 1 tablet (10 mEq total) by mouth daily. 90 tablet 0   Current Facility-Administered Medications  Medication Dose Route Frequency Provider Last Rate Last Admin   0.9 %  sodium chloride infusion  500 mL Intravenous Once Ladene Artist, MD        Allergies as of 09/17/2021 - Review Complete 09/17/2021  Allergen Reaction Noted   Lidocaine-epinephrine Palpitations 04/11/2021   Sulfonamide derivatives Rash     Family History  Problem Relation Age of Onset   Diabetes Mother    Hypertension Mother    Depression Mother    Anxiety disorder Mother    Colon polyps Father  Hypertension Father    Hyperlipidemia Father    Depression Father    Seizures Sister    Depression Sister    Cancer Maternal Aunt        breast   Breast cancer Maternal Aunt    Cancer Paternal Uncle        melanoma   Heart disease Maternal Grandmother        valve disease   Heart disease Maternal Grandfather 70       MI   Cancer Paternal Grandmother        breast   Breast cancer Paternal Grandmother    Cancer Paternal Grandfather        lung   Stroke Neg Hx    Colon cancer Neg Hx    Esophageal cancer Neg Hx    Rectal cancer Neg Hx    Stomach cancer Neg Hx     Social History   Socioeconomic History   Marital status: Divorced    Spouse name: Not  on file   Number of children: Not on file   Years of education: Not on file   Highest education level: Not on file  Occupational History   Not on file  Tobacco Use   Smoking status: Never   Smokeless tobacco: Never  Vaping Use   Vaping Use: Never used  Substance and Sexual Activity   Alcohol use: Yes    Comment: socially   Drug use: No   Sexual activity: Not on file  Other Topics Concern   Not on file  Social History Narrative   Married to female partner, and grandson live with her (daugher lives in [place]) .exercise- not much.  Has a bicycle.  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  Gen: Denies any fever, chills, sweats, anorexia, fatigue, weakness, malaise, weight loss, and sleep disorder CV: Denies chest pain, angina, palpitations, syncope, orthopnea, PND, peripheral edema, and claudication. Resp: Denies dyspnea at rest, dyspnea with exercise, cough, sputum, wheezing, coughing up blood, and pleurisy. GI: Denies vomiting blood, jaundice, and fecal incontinence.   Denies dysphagia or odynophagia. GU : Denies urinary burning, blood in urine, urinary frequency, urinary hesitancy, nocturnal urination, and urinary incontinence. MS: Denies joint pain, limitation of movement, and swelling, stiffness, low back pain, extremity pain. Denies muscle weakness, cramps, atrophy.  Derm: Denies rash, itching, dry skin, hives, moles, warts, or unhealing ulcers.  Psych: Denies depression, anxiety, memory loss, suicidal ideation, hallucinations, paranoia, and confusion. Heme: Denies bruising, bleeding, and enlarged  lymph nodes. Neuro:  Denies any headaches, dizziness, paresthesias. Endo:  Denies any problems with DM, thyroid, adrenal function.   Physical Exam: General:  Alert, well-developed, in NAD Head:  Normocephalic and atraumatic. Eyes:  Sclera clear, no icterus.   Conjunctiva pink. Ears:  Normal auditory acuity. Mouth:  No deformity or lesions.  Neck:  Supple; no masses . Lungs:  Clear throughout to auscultation.   No wheezes, crackles, or rhonchi. No acute distress. Heart:  Regular rate and rhythm; no murmurs. Abdomen:  Soft, nondistended, nontender. No masses, hepatomegaly. No obvious masses.  Normal bowel .  Rectal:  Deferred   Msk:  Symmetrical without gross deformities.. Pulses:  Normal pulses noted. Extremities:  Without edema. Neurologic:  Alert and  oriented x4;  grossly normal neurologically. Skin:  Intact without significant lesions or rashes. Cervical Nodes:  No significant cervical adenopathy. Psych:  Alert and cooperative. Normal mood and affect.   Impression / Plan:   CRC screening average risk for colonoscopy.   This patient is appropriate for endoscopic procedures in the ambulatory setting.    Pricilla Riffle. Fuller Plan  09/17/2021, 2:01 PM

## 2021-09-17 NOTE — Progress Notes (Signed)
VS-AS  Pt's states no medical or surgical changes since previsit or office visit.

## 2021-09-17 NOTE — Patient Instructions (Signed)
Please read handouts provided. Continue present medications. Await pathology results. High Fiber Diet.   YOU HAD AN ENDOSCOPIC PROCEDURE TODAY AT [facility]:   Refer to the procedure report that was given to you for any specific questions about what was found during the examination.  If the procedure report does not answer your questions, please call your gastroenterologist to clarify.  If you requested that your care partner not be given the details of your procedure findings, then the procedure report has been included in a sealed envelope for you to review at your convenience later.  YOU SHOULD EXPECT: Some feelings of bloating in the abdomen. Passage of more gas than usual.  Walking can help get rid of the air that was put into your GI tract during the procedure and reduce the bloating. If you had a lower endoscopy (such as a colonoscopy or flexible sigmoidoscopy) you may notice spotting of blood in your stool or on the toilet paper. If you underwent a bowel prep for your procedure, you may not have a normal bowel movement for a few days.  Please Note:  You might notice some irritation and congestion in your nose or some drainage.  This is from the oxygen used during your procedure.  There is no need for concern and it should clear up in a day or so.  SYMPTOMS TO REPORT IMMEDIATELY:  Following lower endoscopy (colonoscopy or flexible sigmoidoscopy):  Excessive amounts of blood in the stool  Significant tenderness or worsening of abdominal pains  Swelling of the abdomen that is new, acute  Fever of 100F or higher   For urgent or emergent issues, a gastroenterologist can be reached at any hour by calling [phone]. Do not use MyChart messaging for urgent concerns.    DIET:  We do recommend a small meal at first, but then you may proceed to your regular diet.  Drink plenty of fluids but you should avoid alcoholic beverages for 24 hours.  ACTIVITY:  You should plan  to take it easy for the rest of today and you should NOT DRIVE or use heavy machinery until tomorrow (because of the sedation medicines used during the test).    FOLLOW UP: Our staff will call the number listed on your records 48-72 hours following your procedure to check on you and address any questions or concerns that you may have regarding the information given to you following your procedure. If we do not reach you, we will leave a message.  We will attempt to reach you two times.  During this call, we will ask if you have developed any symptoms of COVID 19. If you develop any symptoms (ie: fever, flu-like symptoms, shortness of breath, cough etc.) before then, please call [phone].  If you test positive for Covid 19 in the 2 weeks post procedure, please call and report this information to [place].    If any biopsies were taken you will be contacted by phone or by letter within the next 1-3 weeks.  Please call us at [phone] if you have not heard about the biopsies in 3 weeks.    SIGNATURES/CONFIDENTIALITY: You and/or your care partner have signed paperwork which will be entered into your electronic medical record.  These signatures attest to the fact that that the information above on your After Visit Summary has been reviewed and is understood.  Full responsibility of the confidentiality of this discharge information lies with you and/or your care-partner.

## 2021-09-17 NOTE — Progress Notes (Signed)
A/ox3, pleased with MAC, report to RN 

## 2021-09-17 NOTE — Progress Notes (Signed)
Called to room to assist during endoscopic procedure.  Patient ID and intended procedure confirmed with present staff. Received instructions for my participation in the procedure from the performing physician.  

## 2021-09-17 NOTE — Op Note (Signed)
[name] Procedure Date: 09/17/2021 1:58 PM MRN: [id number] Endoscopist: Ladene Artist , MD Age: 50 Referring MD:  Date of Birth: August 05, 1971 Gender: Female Account #: [id number] Procedure:                Colonoscopy Indications:              Screening for colorectal malignant neoplasm Medicines:                Monitored Anesthesia Care Procedure:                Pre-Anesthesia Assessment:                           - Prior to the procedure, a History and Physical                            was performed, and patient medications and                            allergies were reviewed. The patient's tolerance of                            previous anesthesia was also reviewed. The risks                            and benefits of the procedure and the sedation                            options and risks were discussed with the patient.                            All questions were answered, and informed consent                            was obtained. Prior Anticoagulants: The patient has                            taken no previous anticoagulant or antiplatelet                            agents. ASA Grade Assessment: II - A patient with                            mild systemic disease. After reviewing the risks                            and benefits, the patient was deemed in                            satisfactory condition to undergo the procedure.                           After obtaining informed consent, the colonoscope  was passed under direct vision. Throughout the                            procedure, the patient's blood pressure, pulse, and                            oxygen saturations were monitored continuously. The                            Olympus CF-HQ190L [id number]) Colonoscope was                            introduced through the anus and advanced to the the                            cecum,  identified by appendiceal orifice and                            ileocecal valve. The ileocecal valve, appendiceal                            orifice, and rectum were photographed. The quality                            of the bowel preparation was good. The colonoscopy                            was performed without difficulty. The patient                            tolerated the procedure well. Scope In: 2:03:28 PM Scope Out: 2:18:41 PM Scope Withdrawal Time: 0 hours 12 minutes 51 seconds  Total Procedure Duration: 0 hours 15 minutes 13 seconds  Findings:                 The perianal and digital rectal examinations were                            normal.                           Three sessile polyps were found in the sigmoid                            colon. The polyps were 6 to 8 mm in size. These                            polyps were removed with a cold snare. Resection                            and retrieval were complete.                           Multiple small-mouthed diverticula were found in  the left colon. There was no evidence of                            diverticular bleeding.                           Internal hemorrhoids were found during                            retroflexion. The hemorrhoids were small and Grade                            I (internal hemorrhoids that do not prolapse).                           The exam was otherwise without abnormality on                            direct and retroflexion views. Complications:            No immediate complications. Estimated blood loss:                            None. Estimated Blood Loss:     Estimated blood loss: none. Impression:               - Three 6 to 8 mm polyps in the sigmoid colon,                            removed with a cold snare. Resected and retrieved.                           - Moderate diverticulosis in the left colon.                           - Internal  hemorrhoids.                           - The examination was otherwise normal on direct                            and retroflexion views. Recommendation:           - Repeat colonoscopy after studies are complete for                            surveillance based on pathology results.                           - Patient has a contact number available for                            emergencies. The signs and symptoms of potential                            delayed complications were discussed with the  patient. Return to normal activities tomorrow.                            Written discharge instructions were provided to the                            patient.                           - High fiber diet.                           - Continue present medications.                           - Await pathology results. Ladene Artist, MD 09/17/2021 2:21:06 PM This report has been signed electronically.

## 2021-09-19 ENCOUNTER — Telehealth: Payer: Self-pay | Admitting: *Deleted

## 2021-09-19 ENCOUNTER — Telehealth: Payer: Self-pay

## 2021-09-19 NOTE — Telephone Encounter (Signed)
Attempted to call patient for their post-procedure follow-up call. No answer. Left voicemail.   

## 2021-09-19 NOTE — Telephone Encounter (Signed)
  Follow up Call-  Call back number 09/17/2021  Post procedure Call Back phone  # [phone]  Permission to leave phone message Yes  Some recent data might be hidden     Patient questions:  Do you have a fever, pain , or abdominal swelling? No. Pain Score  0 *  Have you tolerated food without any problems? Yes.    Have you been able to return to your normal activities? Yes.    Do you have any questions about your discharge instructions: Diet   No. Medications  No. Follow up visit  No.  Do you have questions or concerns about your Care? No.  Actions: * If pain score is 4 or above: No action needed, pain <4.  Have you developed a fever since your procedure? no  2.   Have you had an respiratory symptoms (SOB or cough) since your procedure? no  3.   Have you tested positive for COVID 19 since your procedure no  4.   Have you had any family members/close contacts diagnosed with the COVID 19 since your procedure?  no   If yes to any of these questions please route to Joylene John, RN and Joella Prince, RN

## 2021-10-01 ENCOUNTER — Encounter: Payer: Self-pay | Admitting: Gastroenterology

## 2021-10-22 ENCOUNTER — Other Ambulatory Visit: Payer: Self-pay

## 2021-10-22 ENCOUNTER — Other Ambulatory Visit (INDEPENDENT_AMBULATORY_CARE_PROVIDER_SITE_OTHER): Payer: BC Managed Care – PPO

## 2021-10-22 ENCOUNTER — Telehealth (INDEPENDENT_AMBULATORY_CARE_PROVIDER_SITE_OTHER): Payer: BC Managed Care – PPO | Admitting: Family Medicine

## 2021-10-22 ENCOUNTER — Encounter: Payer: Self-pay | Admitting: Internal Medicine

## 2021-10-22 ENCOUNTER — Other Ambulatory Visit: Payer: Self-pay | Admitting: *Deleted

## 2021-10-22 ENCOUNTER — Encounter: Payer: Self-pay | Admitting: Family Medicine

## 2021-10-22 VITALS — Temp 100.8°F | Ht 65.0 in | Wt 230.0 lb

## 2021-10-22 DIAGNOSIS — R509 Fever, unspecified: Secondary | ICD-10-CM

## 2021-10-22 DIAGNOSIS — R52 Pain, unspecified: Secondary | ICD-10-CM | POA: Diagnosis not present

## 2021-10-22 DIAGNOSIS — J029 Acute pharyngitis, unspecified: Secondary | ICD-10-CM | POA: Diagnosis not present

## 2021-10-22 DIAGNOSIS — J020 Streptococcal pharyngitis: Secondary | ICD-10-CM | POA: Diagnosis not present

## 2021-10-22 LAB — POCT INFLUENZA A/B
Influenza A, POC: NEGATIVE
Influenza B, POC: NEGATIVE

## 2021-10-22 LAB — POC COVID19 BINAXNOW: SARS Coronavirus 2 Ag: NEGATIVE

## 2021-10-22 LAB — POCT RAPID STREP A (OFFICE): Rapid Strep A Screen: POSITIVE — AB

## 2021-10-22 MED ORDER — AMOXICILLIN 875 MG PO TABS: 875.0000 mg | ORAL_TABLET | Freq: Two times a day (BID) | ORAL | 0 refills | Status: DC

## 2021-10-22 NOTE — Progress Notes (Signed)
Start time: 9:25 End time: 9:45  Virtual Visit via Video Note  I connected with Jennifer Leonard on 10/22/21 by a video enabled telemedicine application and verified that I am speaking with the correct person using two identifiers.  Location: Patient: home Provider: office   I discussed the limitations of evaluation and management by telemedicine and the availability of in person appointments. The patient expressed understanding and agreed to proceed.  History of Present Illness:  Chief Complaint  Patient presents with   Sore Throat    VIRTUAL sore throat, body aches. Fever. Grandson tested positive for strep Thursday and lives in her house.    She had a HA 2 days ago. Yesterday morning she had a scratchy throat, slightly hoarse. She then developed a lot of aching in muscles and bones, ST was worse.  Now she feels like there is a golfball in the back of her throat, hurts to swallow.  Has some PND.  Takes Xyzal daily for allergies. Not blowing nose. She has some pressure across her forehead.  Took ibuprofen this morning, and using lozenges. Ibuprofen helps with the aching, tylenol did not.  Denies nasal congestion. Sneezed a lot on Monday at work. She is tender all along her neck, where glands are.  T100.8 before ibuprofen  +sick contact, grandson was diagnosed with strep last Thursday, lives with her.  No loss of taste or smell. No n/v/d  She has had her flu shot 2 weeks ago at [facility]. She has had 3 COVID vaccines (not the new one).  PMH, PSH, SH reviewed  Outpatient Encounter Medications as of 10/22/2021  Medication Sig Note   amLODipine (NORVASC) 10 MG tablet Take 1 tablet (10 mg total) by mouth daily.    cholecalciferol (VITAMIN D3) 25 MCG (1000 UNIT) tablet Take 1,000 Units by mouth daily.    esomeprazole (NEXIUM) 20 MG capsule Take 20 mg daily at 12 noon by mouth.    hydrochlorothiazide (HYDRODIURIL) 25 MG tablet Take 1 tablet (25 mg total) by mouth daily.     ibuprofen (ADVIL) 200 MG tablet Take 400 mg by mouth every 6 (six) hours as needed. 10/22/2021: Last dose 7:30am   [DISCONTINUED] Ascorbic Acid (VITAMIN C) 1000 MG tablet Take 1,000 mg by mouth daily. (Patient not taking: No sig reported)    [DISCONTINUED] potassium chloride (KLOR-CON) 10 MEQ tablet Take 1 tablet (10 mEq total) by mouth daily. (Patient not taking: Reported on 10/22/2021)    [DISCONTINUED] Vitamin D, Ergocalciferol, (DRISDOL) 1.25 MG (50000 UNIT) CAPS capsule Take 1 capsule (50,000 Units total) by mouth every 7 (seven) days.    No facility-administered encounter medications on file as of 10/22/2021.   Allergies  Allergen Reactions   Lidocaine-Epinephrine Palpitations    Patient tolerated 1% lidocaine without difficulty.   Sulfonamide Derivatives Rash   ROS: fever and URI symptoms per HPI. No cough, chest pain, shortness of breath, rash, n/v/d or other concerns. See HPI   Observations/Objective:  Temp (!) 100.8 F (38.2 C) (Temporal)   Ht 5\' 5"  (1.651 m)   Wt 230 lb (104.3 kg)   LMP 09/27/2021   BMI 38.27 kg/m   Well-appearing female, in no distress She is alert, oriented, normal speech, eye contact and grooming. Cranial nerves grossly intact. Exam limited due to virtual nature of the visit.  Influenza negative for A&B Negative rapid COVID +rapid strep  Assessment and Plan:   Strep pharyngitis - Plan: amoxicillin (AMOXIL) 875 MG tablet   We discussed flu/COVID/strep testing at  office, and what to do based on results. Advised if COVID negative to come for new bivalent COVID booster when she is feeling better. Discussed antivral medication in case of +COVID (and risks/SE, reasons to take vs not, leaving decision up to her).      Follow Up Instructions:    I discussed the assessment and treatment plan with the patient. The patient was provided an opportunity to ask questions and all were answered. The patient agreed with the plan and demonstrated an  understanding of the instructions.   The patient was advised to call back or seek an in-person evaluation if the symptoms worsen or if the condition fails to improve as anticipated.  I spent 24 minutes dedicated to the care of this patient, including pre-visit review of records, face to face time, post-visit ordering of testing and documentation.    Vikki Ports, MD

## 2021-10-22 NOTE — Patient Instructions (Addendum)
Your strep test was positive, COVID and flu tests were negative. I sent in the prescription for the antibiotic to your [facility]. I hope you feel better soon!  When you are feeling better, return for COVID bivalent booster.

## 2021-10-23 LAB — NOVEL CORONAVIRUS, NAA: SARS-CoV-2, NAA: NOT DETECTED

## 2021-10-23 LAB — SARS-COV-2, NAA 2 DAY TAT

## 2021-11-16 ENCOUNTER — Other Ambulatory Visit: Payer: Self-pay | Admitting: Medical

## 2021-12-05 ENCOUNTER — Other Ambulatory Visit: Payer: Self-pay | Admitting: Medical

## 2021-12-05 ENCOUNTER — Telehealth: Payer: Self-pay | Admitting: Medical

## 2021-12-05 MED ORDER — POTASSIUM CHLORIDE CRYS ER 10 MEQ PO TBCR: 10.0000 meq | EXTENDED_RELEASE_TABLET | Freq: Every day | ORAL | 0 refills | Status: DC

## 2021-12-05 MED ORDER — AMLODIPINE BESYLATE 10 MG PO TABS: 10.0000 mg | ORAL_TABLET | Freq: Every day | ORAL | 0 refills | Status: DC

## 2021-12-05 MED ORDER — HYDROCHLOROTHIAZIDE 25 MG PO TABS: 25.0000 mg | ORAL_TABLET | Freq: Every day | ORAL | 0 refills | Status: DC

## 2021-12-05 NOTE — Telephone Encounter (Signed)
Pt called and made a CPE for February. Please refill BP med until that appt.

## 2022-02-18 ENCOUNTER — Other Ambulatory Visit: Payer: Self-pay | Admitting: *Deleted

## 2022-02-18 ENCOUNTER — Ambulatory Visit: Payer: BC Managed Care – PPO | Admitting: Medical

## 2022-02-18 ENCOUNTER — Encounter: Payer: Self-pay | Admitting: Medical

## 2022-02-18 ENCOUNTER — Other Ambulatory Visit: Payer: Self-pay

## 2022-02-18 ENCOUNTER — Encounter: Payer: Self-pay | Admitting: *Deleted

## 2022-02-18 VITALS — BP 120/70 | HR 75 | Ht 66.0 in | Wt 212.4 lb

## 2022-02-18 DIAGNOSIS — Z13220 Encounter for screening for lipoid disorders: Secondary | ICD-10-CM

## 2022-02-18 DIAGNOSIS — Z0000 Encounter for general adult medical examination without abnormal findings: Secondary | ICD-10-CM | POA: Diagnosis not present

## 2022-02-18 DIAGNOSIS — Z7185 Encounter for immunization safety counseling: Secondary | ICD-10-CM

## 2022-02-18 DIAGNOSIS — Z808 Family history of malignant neoplasm of other organs or systems: Secondary | ICD-10-CM

## 2022-02-18 DIAGNOSIS — L989 Disorder of the skin and subcutaneous tissue, unspecified: Secondary | ICD-10-CM

## 2022-02-18 DIAGNOSIS — K219 Gastro-esophageal reflux disease without esophagitis: Secondary | ICD-10-CM

## 2022-02-18 DIAGNOSIS — I10 Essential (primary) hypertension: Secondary | ICD-10-CM

## 2022-02-18 DIAGNOSIS — R7301 Impaired fasting glucose: Secondary | ICD-10-CM

## 2022-02-18 DIAGNOSIS — Z131 Encounter for screening for diabetes mellitus: Secondary | ICD-10-CM

## 2022-02-18 DIAGNOSIS — Z8249 Family history of ischemic heart disease and other diseases of the circulatory system: Secondary | ICD-10-CM

## 2022-02-18 DIAGNOSIS — E559 Vitamin D deficiency, unspecified: Secondary | ICD-10-CM

## 2022-02-18 DIAGNOSIS — N951 Menopausal and female climacteric states: Secondary | ICD-10-CM

## 2022-02-18 DIAGNOSIS — E876 Hypokalemia: Secondary | ICD-10-CM

## 2022-02-18 LAB — LIPID PANEL

## 2022-02-18 NOTE — Progress Notes (Signed)
Subjective:   HPI  Jennifer Leonard is a 51 y.o. female who presents for Chief Complaint  Patient presents with   fasting cpe    CPE, had fasting labs done this morning. Not taking any meds for the last couple months. [facility]yn[facility]    Patient Care Team: Antwaun Buth, Leward Quan as PCP - General (Family Medicine) Sees dentist Sees eye doctor Dr. Lucio Edward, GI Dr. Dian Queen, gyn   Concerns: Started [employer] weight loss program 10/2021.  Lost from 239lb down to 207lb per her scale.   started [employer] weight loss program since 10/2021.     Stopped medicaiton in December   Had covid shots initially at the [employer] in 2020.    Wife been in [facility] x 5 weeks with endocarditis unfortunately   Reviewed their medical, surgical, family, social, medication, and allergy history and updated chart as appropriate.  Past Medical History:  Diagnosis Date   Anxiety    GERD (gastroesophageal reflux disease)    History of mammogram    through gyn   Hypertension    Routine gynecological examination    Dr. Helane Rima   Wears glasses    astigmatism    Family History  Problem Relation Age of Onset   Diabetes Mother    Hypertension Mother    Depression Mother    Anxiety disorder Mother    Colon polyps Father    Hypertension Father    Hyperlipidemia Father    Depression Father    Seizures Sister    Depression Sister    Cancer Maternal Aunt        breast   Breast cancer Maternal Aunt    Cancer Paternal Uncle        melanoma   Heart disease Maternal Grandmother        valve disease   Heart disease Maternal Grandfather 6       MI   Cancer Paternal Grandmother        breast   Breast cancer Paternal Grandmother    Cancer Paternal Grandfather        lung   Stroke Neg Hx    Colon cancer Neg Hx    Esophageal cancer Neg Hx    Rectal cancer Neg Hx    Stomach cancer Neg Hx      Current Outpatient Medications:    amLODipine (NORVASC) 10 MG  tablet, Take 1 tablet (10 mg total) by mouth daily. (Patient not taking: Reported on 02/18/2022), Disp: 90 tablet, Rfl: 0   cholecalciferol (VITAMIN D3) 25 MCG (1000 UNIT) tablet, Take 1,000 Units by mouth daily. (Patient not taking: Reported on 02/18/2022), Disp: , Rfl:    esomeprazole (NEXIUM) 20 MG capsule, Take 20 mg daily at 12 noon by mouth. (Patient not taking: Reported on 02/18/2022), Disp: , Rfl:    hydrochlorothiazide (HYDRODIURIL) 25 MG tablet, Take 1 tablet (25 mg total) by mouth daily. (Patient not taking: Reported on 02/18/2022), Disp: 90 tablet, Rfl: 0   ibuprofen (ADVIL) 200 MG tablet, Take 400 mg by mouth every 6 (six) hours as needed. (Patient not taking: Reported on 02/18/2022), Disp: , Rfl:    potassium chloride (KLOR-CON M) 10 MEQ tablet, Take 1 tablet (10 mEq total) by mouth daily. (Patient not taking: Reported on 02/18/2022), Disp: 90 tablet, Rfl: 0  Allergies  Allergen Reactions   Lidocaine-Epinephrine Palpitations    Patient tolerated 1% lidocaine without difficulty.   Sulfonamide Derivatives Rash     Review of  Systems Constitutional: -fever, -chills, -sweats, -unexpected weight change, -decreased appetite, -fatigue Allergy: -sneezing, -itching, -congestion Dermatology: -changing moles, --rash, -lumps ENT: -runny nose, -ear pain, -sore throat, -hoarseness, -sinus pain, -teeth pain, - ringing in ears, -hearing loss, -nosebleeds Cardiology: -chest pain, -palpitations, -swelling, -difficulty breathing when lying flat, -waking up short of breath Respiratory: -cough, -shortness of breath, -difficulty breathing with exercise or exertion, -wheezing, -coughing up blood Gastroenterology: -abdominal pain, -nausea, -vomiting, -diarrhea, -constipation, -blood in stool, -changes in bowel movement, -difficulty swallowing or eating Hematology: -bleeding, -bruising  Musculoskeletal: -joint aches, -muscle aches, -joint swelling, -back pain, -neck pain, -cramping, -changes in  gait Ophthalmology: denies vision changes, eye redness, itching, discharge Urology: -burning with urination, -difficulty urinating, -blood in urine, -urinary frequency, -urgency, -incontinence Neurology: -headache, -weakness, -tingling, -numbness, -memory loss, -falls, -dizziness Psychology: -depressed mood, -agitation, -sleep problems Breast/gyn: -breast tendnerss, -discharge, -lumps, -vaginal discharge,- irregular periods, -heavy periods   Depression screen [facility] 2/9 02/18/2022 08/19/2020 05/31/2019 11/15/2017  Decreased Interest 0 3 0 0  Down, Depressed, Hopeless 0 3 1 0  PHQ - 2 Score 0 6 1 0  Altered sleeping 0 2 - -  Tired, decreased energy 0 3 - -  Change in appetite 0 1 - -  Feeling bad or failure about yourself  0 3 - -  Trouble concentrating 0 0 - -  Moving slowly or fidgety/restless 0 0 - -  Suicidal thoughts 0 2 - -  PHQ-9 Score 0 17 - -  Difficult doing work/chores Not difficult at all Very difficult - -       Objective:  BP 120/70    Pulse 75    Ht 5\' 6"  (1.676 m)    Wt 212 lb 6.4 oz (96.3 kg)    BMI 34.28 kg/m   General appearance: alert, no distress, WD/WN, Caucasian female Skin: scattered macules, no worrisome lesions HEENT: normocephalic, conjunctiva/corneas normal, sclerae anicteric, PERRLA, EOMi Neck: supple, no lymphadenopathy, no thyromegaly, no masses, normal ROM, no bruits Chest: non tender, normal shape and expansion Heart: RRR, normal S1, S2, no murmurs Lungs: CTA bilaterally, no wheezes, rhonchi, or rales Abdomen: +bs, soft, non tender, non distended, no masses, no hepatomegaly, no splenomegaly, no bruits Back: non tender, normal ROM, no scoliosis Musculoskeletal: upper extremities non tender, no obvious deformity, normal ROM throughout, lower extremities non tender, no obvious deformity, normal ROM throughout Extremities: no edema, no cyanosis, no clubbing Pulses: 2+ symmetric, upper and lower extremities, normal cap refill Neurological: alert, oriented x  3, CN2-12 intact, strength normal upper extremities and lower extremities, sensation normal throughout, DTRs 2+ throughout, no cerebellar signs, gait normal Psychiatric: normal affect, behavior normal, pleasant  Breast/gyn/rectal - deferred to gynecology    Assessment and Plan :   Encounter Diagnoses  Name Primary?   Encounter for health maintenance examination in adult Yes   Vitamin D deficiency    Vaccine counseling    Essential hypertension    Family history of heart disease in female family member before age 14    Family history of melanoma    Gastroesophageal reflux disease without esophagitis    Hypokalemia    Impaired fasting glucose    Perimenopausal    Screening for diabetes mellitus    Screening for lipid disorders    Skin lesions      This visit was a preventative care visit, also known as wellness visit or routine physical.   Topics typically include healthy lifestyle, diet, exercise, preventative care, vaccinations, sick and well care, proper use  of emergency dept and after hours care, as well as other concerns.     Recommendations: Continue to return yearly for your annual wellness and preventative care visits.  This gives [place] a chance to discuss healthy lifestyle, exercise, vaccinations, review your chart record, and perform screenings where appropriate.  I recommend you see your eye doctor yearly for routine vision care.  I recommend you see your dentist yearly for routine dental care including hygiene visits twice yearly.   Vaccination recommendations were reviewed Immunization History  Administered Date(s) Administered   Influenza,inj,Quad PF,6+ Mos 11/27/2017, 08/20/2018   Influenza-Unspecified 08/28/2018, 10/04/2021   Tdap 04/16/2014    Shingles vaccine:  I recommend you have a shingles vaccine to help prevent shingles or herpes zoster outbreak.   Please call your insurer to inquire about coverage for the Shingrix vaccine given in 2 doses.   Some  insurers cover this vaccine after age 87, some cover this after age 62.  If your insurer covers this, then call to schedule appointment to have this vaccine here.   Screening for cancer: Colon cancer screening: I reviewed your colonoscopy on file that is up to date from 08/2021  Breast cancer screening: You should perform a self breast exam monthly.   We reviewed recommendations for regular mammograms and breast cancer screening.  Cervical cancer screening: We reviewed recommendations for pap smear screening.   Skin cancer screening: Check your skin regularly for new changes, growing lesions, or other lesions of concern Come in for evaluation if you have skin lesions of concern.  Lung cancer screening: If you have a greater than 20 pack year history of tobacco use, then you may qualify for lung cancer screening with a chest CT scan.   Please call your insurance company to inquire about coverage for this test.  We currently don't have screenings for other cancers besides breast, cervical, colon, and lung cancers.  If you have a strong family history of cancer or have other cancer screening concerns, please let me know.    Bone health: Get at least 150 minutes of aerobic exercise weekly Get weight bearing exercise at least once weekly Bone density test:  A bone density test is an imaging test that uses a type of X-ray to measure the amount of calcium and other minerals in your bones. The test may be used to diagnose or screen you for a condition that causes weak or thin bones (osteoporosis), predict your risk for a broken bone (fracture), or determine how well your osteoporosis treatment is working. The bone density test is recommended for females 71 and older, or females or males <26 if certain risk factors such as thyroid disease, long term use of steroids such as for asthma or rheumatological issues, vitamin D deficiency, estrogen deficiency, family history of osteoporosis, self or  family history of fragility fracture in first degree relative.    Heart health: Get at least 150 minutes of aerobic exercise weekly Limit alcohol It is important to maintain a healthy blood pressure and healthy cholesterol numbers  Heart disease screening: Screening for heart disease includes screening for blood pressure, fasting lipids, glucose/diabetes screening, BMI height to weight ratio, reviewed of smoking status, physical activity, and diet.    Goals include blood pressure 120/80 or less, maintaining a healthy lipid/cholesterol profile, preventing diabetes or keeping diabetes numbers under good control, not smoking or using tobacco products, exercising most days per week or at least 150 minutes per week of exercise, and eating healthy variety of  fruits and vegetables, healthy oils, and avoiding unhealthy food choices like fried food, fast food, high sugar and high cholesterol foods.    Other tests may possibly include EKG test, CT coronary calcium score, echocardiogram, exercise treadmill stress test.     Medical care options: I recommend you continue to seek care here first for routine care.  We try really hard to have available appointments Monday through Friday daytime hours for sick visits, acute visits, and physicals.  Urgent care should be used for after hours and weekends for significant issues that cannot wait till the next day.  The emergency department should be used for significant potentially life-threatening emergencies.  The emergency department is expensive, can often have long wait times for less significant concerns, so try to utilize primary care, urgent care, or telemedicine when possible to avoid unnecessary trips to the emergency department.  Virtual visits and telemedicine have been introduced since the pandemic started in 2020, and can be convenient ways to receive medical care.  We offer virtual appointments as well to assist you in a variety of options to seek  medical care.   Advanced Directives: I recommend you consider completing a [place] and Living Will.   These documents respect your wishes and help alleviate burdens on your loved ones if you were to become terminally ill or be in a position to need those documents enforced.    You can complete Advanced Directives yourself, have them notarized, then have copies made for our office, for you and for anybody you feel should have them in safe keeping.  Or, you can have an attorney prepare these documents.   If you haven't updated your Last Will and Testament in a while, it may be worthwhile having an attorney prepare these documents together and save on some costs.       Separate significant issues discussed: Try to get exercise several days per week, aim for at least 150 minutes/week  We will recheck labs today.  Currently your blood pressure looks great.    Gavrielle was seen today for fasting cpe.  Diagnoses and all orders for this visit:  Encounter for health maintenance examination in adult -     Comprehensive metabolic panel -     CBC -     Lipid panel -     Hemoglobin A1c -     VITAMIN D 25 Hydroxy (Vit-D Deficiency, Fractures)  Vitamin D deficiency -     VITAMIN D 25 Hydroxy (Vit-D Deficiency, Fractures)  Vaccine counseling  Essential hypertension -     Lipid panel  Family history of heart disease in female family member before age 46  Family history of melanoma  Gastroesophageal reflux disease without esophagitis  Hypokalemia  Impaired fasting glucose -     Hemoglobin A1c  Perimenopausal  Screening for diabetes mellitus  Screening for lipid disorders  Skin lesions    Follow-up pending labs, yearly for physical

## 2022-02-18 NOTE — Patient Instructions (Signed)
This visit was a preventative care visit, also known as wellness visit or routine physical.   Topics typically include healthy lifestyle, diet, exercise, preventative care, vaccinations, sick and well care, proper use of emergency dept and after hours care, as well as other concerns.     Recommendations: Continue to return yearly for your annual wellness and preventative care visits.  This gives [place] a chance to discuss healthy lifestyle, exercise, vaccinations, review your chart record, and perform screenings where appropriate.  I recommend you see your eye doctor yearly for routine vision care.  I recommend you see your dentist yearly for routine dental care including hygiene visits twice yearly.   Vaccination recommendations were reviewed Immunization History  Administered Date(s) Administered   Influenza,inj,Quad PF,6+ Mos 11/27/2017, 08/20/2018   Influenza-Unspecified 08/28/2018, 10/04/2021   Tdap 04/16/2014    Shingles vaccine:  I recommend you have a shingles vaccine to help prevent shingles or herpes zoster outbreak.   Please call your insurer to inquire about coverage for the Shingrix vaccine given in 2 doses.   Some insurers cover this vaccine after age 64, some cover this after age 15.  If your insurer covers this, then call to schedule appointment to have this vaccine here.   Screening for cancer: Colon cancer screening: I reviewed your colonoscopy on file that is up to date from 08/2021  Breast cancer screening: You should perform a self breast exam monthly.   We reviewed recommendations for regular mammograms and breast cancer screening.  Cervical cancer screening: We reviewed recommendations for pap smear screening.   Skin cancer screening: Check your skin regularly for new changes, growing lesions, or other lesions of concern Come in for evaluation if you have skin lesions of concern.  Lung cancer screening: If you have a greater than 20 pack year history of  tobacco use, then you may qualify for lung cancer screening with a chest CT scan.   Please call your insurance company to inquire about coverage for this test.  We currently don't have screenings for other cancers besides breast, cervical, colon, and lung cancers.  If you have a strong family history of cancer or have other cancer screening concerns, please let me know.    Bone health: Get at least 150 minutes of aerobic exercise weekly Get weight bearing exercise at least once weekly Bone density test:  A bone density test is an imaging test that uses a type of X-ray to measure the amount of calcium and other minerals in your bones. The test may be used to diagnose or screen you for a condition that causes weak or thin bones (osteoporosis), predict your risk for a broken bone (fracture), or determine how well your osteoporosis treatment is working. The bone density test is recommended for females 46 and older, or females or males <37 if certain risk factors such as thyroid disease, long term use of steroids such as for asthma or rheumatological issues, vitamin D deficiency, estrogen deficiency, family history of osteoporosis, self or family history of fragility fracture in first degree relative.    Heart health: Get at least 150 minutes of aerobic exercise weekly Limit alcohol It is important to maintain a healthy blood pressure and healthy cholesterol numbers  Heart disease screening: Screening for heart disease includes screening for blood pressure, fasting lipids, glucose/diabetes screening, BMI height to weight ratio, reviewed of smoking status, physical activity, and diet.    Goals include blood pressure 120/80 or less, maintaining a healthy lipid/cholesterol profile,  preventing diabetes or keeping diabetes numbers under good control, not smoking or using tobacco products, exercising most days per week or at least 150 minutes per week of exercise, and eating healthy variety of fruits and  vegetables, healthy oils, and avoiding unhealthy food choices like fried food, fast food, high sugar and high cholesterol foods.    Other tests may possibly include EKG test, CT coronary calcium score, echocardiogram, exercise treadmill stress test.     Medical care options: I recommend you continue to seek care here first for routine care.  We try really hard to have available appointments Monday through Friday daytime hours for sick visits, acute visits, and physicals.  Urgent care should be used for after hours and weekends for significant issues that cannot wait till the next day.  The emergency department should be used for significant potentially life-threatening emergencies.  The emergency department is expensive, can often have long wait times for less significant concerns, so try to utilize primary care, urgent care, or telemedicine when possible to avoid unnecessary trips to the emergency department.  Virtual visits and telemedicine have been introduced since the pandemic started in 2020, and can be convenient ways to receive medical care.  We offer virtual appointments as well to assist you in a variety of options to seek medical care.   Advanced Directives: I recommend you consider completing a [place] and Living Will.   These documents respect your wishes and help alleviate burdens on your loved ones if you were to become terminally ill or be in a position to need those documents enforced.    You can complete Advanced Directives yourself, have them notarized, then have copies made for our office, for you and for anybody you feel should have them in safe keeping.  Or, you can have an attorney prepare these documents.   If you haven't updated your Last Will and Testament in a while, it may be worthwhile having an attorney prepare these documents together and save on some costs.       Separate significant issues discussed: Try to get exercise several days per week,  aim for at least 150 minutes/week  We will recheck labs today.  Currently your blood pressure looks great.

## 2022-02-19 LAB — COMPREHENSIVE METABOLIC PANEL
ALT: 39 IU/L — ABNORMAL HIGH (ref 0–32)
AST: 25 IU/L (ref 0–40)
Albumin/Globulin Ratio: 1.8 (ref 1.2–2.2)
Albumin: 4.4 g/dL (ref 3.8–4.8)
Alkaline Phosphatase: 114 IU/L (ref 44–121)
BUN/Creatinine Ratio: 15 (ref 9–23)
BUN: 12 mg/dL (ref 6–24)
Bilirubin Total: 0.3 mg/dL (ref 0.0–1.2)
CO2: 25 mmol/L (ref 20–29)
Calcium: 9.4 mg/dL (ref 8.7–10.2)
Chloride: 105 mmol/L (ref 96–106)
Creatinine, Ser: 0.8 mg/dL (ref 0.57–1.00)
Globulin, Total: 2.5 g/dL (ref 1.5–4.5)
Glucose: 103 mg/dL — ABNORMAL HIGH (ref 70–99)
Potassium: 4.1 mmol/L (ref 3.5–5.2)
Sodium: 142 mmol/L (ref 134–144)
Total Protein: 6.9 g/dL (ref 6.0–8.5)
eGFR: 90 mL/min/{1.73_m2} (ref >59)

## 2022-02-19 LAB — VITAMIN D 25 HYDROXY (VIT D DEFICIENCY, FRACTURES): Vit D, 25-Hydroxy: 47 ng/mL (ref 30.0–100.0)

## 2022-02-19 LAB — CBC
Hematocrit: 40.3 % (ref 34.0–46.6)
Hemoglobin: 13.3 g/dL (ref 11.1–15.9)
MCH: 27.9 pg (ref 26.6–33.0)
MCHC: 33 g/dL (ref 31.5–35.7)
MCV: 85 fL (ref 79–97)
Platelets: 221 10*3/uL (ref 150–450)
RBC: 4.76 x10E6/uL (ref 3.77–5.28)
RDW: 13.2 % (ref 11.7–15.4)
WBC: 8.2 10*3/uL (ref 3.4–10.8)

## 2022-02-19 LAB — LIPID PANEL
Chol/HDL Ratio: 3.4 ratio (ref 0.0–4.4)
Cholesterol, Total: 151 mg/dL (ref 100–199)
HDL: 45 mg/dL (ref >39)
LDL Chol Calc (NIH): 91 mg/dL (ref 0–99)
Triglycerides: 80 mg/dL (ref 0–149)
VLDL Cholesterol Cal: 15 mg/dL (ref 5–40)

## 2022-02-19 LAB — HEMOGLOBIN A1C
Est. average glucose Bld gHb Est-mCnc: 111 mg/dL
Hgb A1c MFr Bld: 5.5 % (ref 4.8–5.6)

## 2022-04-06 ENCOUNTER — Encounter: Payer: Self-pay | Admitting: Family Medicine

## 2022-04-06 ENCOUNTER — Ambulatory Visit: Payer: BC Managed Care – PPO | Admitting: Family Medicine

## 2022-04-06 VITALS — BP 142/82 | HR 57 | Temp 96.8°F | Wt 200.8 lb

## 2022-04-06 DIAGNOSIS — R1012 Left upper quadrant pain: Secondary | ICD-10-CM

## 2022-04-06 DIAGNOSIS — K5790 Diverticulosis of intestine, part unspecified, without perforation or abscess without bleeding: Secondary | ICD-10-CM

## 2022-04-06 NOTE — Patient Instructions (Signed)
Pay attention to the pain in terms of when it occurs, where and what his symptoms. ?Take Tylenol for it and if he needs something stronger he can take either Advil or Aleve ?

## 2022-04-06 NOTE — Progress Notes (Signed)
? ?  Subjective:  ? ? Patient ID: LORRE OPDAHL, female    DOB: 01/25/1971, 51 y.o.   MRN: [id number] ? ?HPI ?She complains of a 1 week history of difficulty with left upper and mid quadrant discomfort.  No nausea, vomiting, diarrhea.  Normal bowel movements.  No urinary symptoms.  Food plays no role in this.  She states that it does seem to get slightly worse as the day goes on.  She has had a previous colonoscopy which did show evidence of diverticulosis. ? ? ?Review of Systems ? ?   ?Objective:  ? Physical Exam ?Alert and in no distress.  Cardiac exam shows regular rhythm without murmurs or gallops.  Lungs are clear to auscultation.  Abdominal exam shows normal bowel sounds with no masses.  Slight discomfort is elicited in the mid left quadrant. ? ? ? ?   ?Assessment & Plan:  ?Left upper quadrant abdominal pain ? ?Diverticulosis ?I explained that this could be smoldering diverticulitis but unlikely.  She is to monitor this as discussed below. ?Pay attention to the pain in terms of when it occurs, where and what his symptoms. ?Take Tylenol for it and if he needs something stronger he can take either Advil or Aleve ?

## 2022-09-02 ENCOUNTER — Encounter: Payer: Self-pay | Admitting: Internal Medicine

## 2022-10-06 ENCOUNTER — Encounter: Payer: Self-pay | Admitting: Internal Medicine

## 2022-10-19 ENCOUNTER — Encounter: Payer: Self-pay | Admitting: Medical

## 2022-12-09 ENCOUNTER — Ambulatory Visit: Payer: Managed Care, Other (non HMO) | Admitting: Medical

## 2022-12-09 ENCOUNTER — Encounter: Payer: Self-pay | Admitting: Medical

## 2022-12-09 VITALS — BP 148/100 | HR 73 | Wt 177.2 lb

## 2022-12-09 DIAGNOSIS — R519 Headache, unspecified: Secondary | ICD-10-CM | POA: Diagnosis not present

## 2022-12-09 DIAGNOSIS — R040 Epistaxis: Secondary | ICD-10-CM | POA: Diagnosis not present

## 2022-12-09 DIAGNOSIS — R002 Palpitations: Secondary | ICD-10-CM | POA: Diagnosis not present

## 2022-12-09 DIAGNOSIS — I10 Essential (primary) hypertension: Secondary | ICD-10-CM | POA: Diagnosis not present

## 2022-12-09 NOTE — Progress Notes (Signed)
Subjective:  Jennifer Leonard is a 51 y.o. female who presents for Chief Complaint  Patient presents with   Palpitations   Hypertension    Was recently seen by urgent care on 12/07/2022 for hypertension and palpitations. Associated symptoms includes headache and nose bleeds. She took one dose of Amlodipine last night.     Here for concerns about BP.  She has a history of hypertension, impaired fasting glucose.  Earlier in the year after losing some weight and working on health lifestyle changes, she had quick taking BP medications and BPs had been doing well off medication for months.   Hasn't been checking BPs in general after normal readings 01/2022.  However, this week hasn't been feeling good.   She went to urgent care a few days ago for the this, and BP was elevated.  She had some left over amlodipine and did take one yesterday but hasn't been taking this in general.  She saw OB/gyn for annual in September and BP was fine then as well.  Been exercising, has lost a lot of weight intentionally this year.  No alcohol, nonsmoker.   Trying to keep on track of health.    Had nosebleed out of the blue last week.  Had stomach virus last week as well, so wasn't feeling great last week.  Today has a headache in back of head.    Has had some recent palpitations.  No recent numbness and tingling or weakness, no slurred speech.   Having constant headache in past week.  She had cardiology eval about 10 years ago that was normal, chest pain attributable to stress.  She had been taking some creatinine supplement as well recently  No other aggravating or relieving factors.    No other c/o.  Past Medical History:  Diagnosis Date   Anxiety    GERD (gastroesophageal reflux disease)    History of mammogram    through gyn   Hypertension    Routine gynecological examination    Dr. Helane Rima   Wears glasses    astigmatism   Current Outpatient Medications on File Prior to Visit  Medication Sig  Dispense Refill   Simethicone (GAS-X PO) Take by mouth.     amLODipine (NORVASC) 10 MG tablet Take 1 tablet (10 mg total) by mouth daily. (Patient not taking: Reported on 02/18/2022) 90 tablet 0   cholecalciferol (VITAMIN D3) 25 MCG (1000 UNIT) tablet Take 1,000 Units by mouth daily. (Patient not taking: Reported on 02/18/2022)     Docusate Sodium (STOOL SOFTENER LAXATIVE PO) Take by mouth. (Patient not taking: Reported on 12/09/2022)     esomeprazole (NEXIUM) 20 MG capsule Take 20 mg daily at 12 noon by mouth. (Patient not taking: Reported on 02/18/2022)     hydrochlorothiazide (HYDRODIURIL) 25 MG tablet Take 1 tablet (25 mg total) by mouth daily. (Patient not taking: Reported on 02/18/2022) 90 tablet 0   ibuprofen (ADVIL) 200 MG tablet Take 400 mg by mouth every 6 (six) hours as needed. (Patient not taking: Reported on 02/18/2022)     potassium chloride (KLOR-CON M) 10 MEQ tablet Take 1 tablet (10 mEq total) by mouth daily. (Patient not taking: Reported on 02/18/2022) 90 tablet 0   No current facility-administered medications on file prior to visit.   Family History  Problem Relation Age of Onset   Diabetes Mother    Hypertension Mother    Depression Mother    Anxiety disorder Mother    Colon polyps Father  Hypertension Father    Hyperlipidemia Father    Depression Father    Seizures Sister    Depression Sister    Cancer Maternal Aunt        breast   Breast cancer Maternal Aunt    Cancer Paternal Uncle        melanoma   Heart disease Maternal Grandmother        valve disease   Heart disease Maternal Grandfather 60       MI   Cancer Paternal Grandmother        breast   Breast cancer Paternal Grandmother    Cancer Paternal Grandfather        lung   Stroke Neg Hx    Colon cancer Neg Hx    Esophageal cancer Neg Hx    Rectal cancer Neg Hx    Stomach cancer Neg Hx      The following portions of the patient's history were reviewed and updated as appropriate: allergies, current  medications, past family history, past medical history, past social history, past surgical history and problem list.  ROS Otherwise as in subjective above    Objective: BP (!) 148/100   Pulse 73   Wt 177 lb 3.2 oz (80.4 kg)   LMP 11/27/2022   SpO2 97% Comment: room air  BMI 28.60 kg/m   BP Readings from Last 3 Encounters:  12/09/22 (!) 148/100  04/06/22 (!) 142/82  02/18/22 120/70   Wt Readings from Last 3 Encounters:  12/09/22 177 lb 3.2 oz (80.4 kg)  04/06/22 200 lb 12.8 oz (91.1 kg)  02/18/22 212 lb 6.4 oz (96.3 kg)   General appearance: alert, no distress, well developed, well nourished HEENT: normocephalic, sclerae anicteric, conjunctiva pink and moist, TMs pearly, nares patent, no discharge or erythema, pharynx normal Oral cavity: MMM, no lesions Neck: supple, no lymphadenopathy, no thyromegaly, no masses, no bruits Heart: RRR, normal S1, S2, no murmurs Lungs: CTA bilaterally, no wheezes, rhonchi, or rales Abdomen: +bs, soft, non tender, non distended, no masses, no hepatomegaly, no splenomegaly, no bruits Pulses: 2+ radial pulses, 2+ pedal pulses, normal cap refill Ext: no edema Neuro: cn2-12 intact, nonfocal exam    Assessment: Encounter Diagnoses  Name Primary?   Essential hypertension Yes   Palpitation    Occipital headache    Epistaxis      Plan: Hypertension-I reviewed her recent urgent care notes.  They did some labs including comprehensive metabolic panel showing sodium 1 point elevated, BUN/creatinine and chloride slightly elevated, otherwise electrolytes and creatinine normal.  Blood counts normal.  EKG was reviewed from that visit today on 12/08/2022 as well showing normal sinus rhythm, sinus bradycardia which is longstanding, but no acute changes  Recommendations: Restart amlodipine 10 mg daily in the morning Limit salt, caffeine, and do not take supplements such as creatinine Limit alcohol Continue with walking and lower intensity exercise  for now, but lay off of weight lifting and high intensity exercise for the time being until you see cardiology I am referring you to cardiology for further evaluation Monitor your pulse and blood pressure at least 3 to 4 days/week in the meantime so the cardiologist can see your numbers  We will check a thyroid lab today for completeness   Keyonna was seen today for palpitations and hypertension.  Diagnoses and all orders for this visit:  Essential hypertension -     TSH  Palpitation -     TSH  Occipital headache  Epistaxis   Follow  up: 39mo

## 2022-12-09 NOTE — Patient Instructions (Signed)
Recommendations: Restart amlodipine 10 mg daily in the morning Limit salt, caffeine, and do not take supplements such as creatinine Limit alcohol Continue with walking and lower intensity exercise for now, but lay off of weight lifting and high intensity exercise for the time being until you see cardiology I am referring you to cardiology for further evaluation Monitor your pulse and blood pressure at least 3 to 4 days/week in the meantime so the cardiologist can see your numbers  We will check a thyroid lab today for completeness

## 2022-12-10 LAB — TSH: TSH: 0.696 u[IU]/mL (ref 0.450–4.500)

## 2022-12-10 NOTE — Progress Notes (Signed)
Results sent through MyChart

## 2023-01-01 ENCOUNTER — Ambulatory Visit: Payer: Managed Care, Other (non HMO) | Attending: Interventional Cardiology | Admitting: Interventional Cardiology

## 2023-01-01 VITALS — BP 110/82 | HR 64 | Ht 63.0 in | Wt 181.0 lb

## 2023-01-01 DIAGNOSIS — R002 Palpitations: Secondary | ICD-10-CM | POA: Diagnosis not present

## 2023-01-01 DIAGNOSIS — I10 Essential (primary) hypertension: Secondary | ICD-10-CM | POA: Diagnosis not present

## 2023-01-01 NOTE — Patient Instructions (Signed)
Medication Instructions:  Your physician recommends that you continue on your current medications as directed. Please refer to the Current Medication list given to you today.  *If you need a refill on your cardiac medications before your next appointment, please call your pharmacy*   Lab Work: none If you have labs (blood work) drawn today and your tests are completely normal, you will receive your results only by: [place] (if you have MyChart) OR A paper copy in the mail If you have any lab test that is abnormal or we need to change your treatment, we will call you to review the results.   Testing/Procedures: none   Follow-Up: At [facility], you and your health needs are our priority.  As part of our continuing mission to provide you with exceptional heart care, we have created designated Provider Care Teams.  These Care Teams include your primary Cardiologist (physician) and Advanced Practice Providers (APPs -  Physician Assistants and Nurse Practitioners) who all work together to provide you with the care you need, when you need it.  We recommend signing up for the patient portal called "MyChart".  Sign up information is provided on this After Visit Summary.  MyChart is used to connect with patients for Virtual Visits (Telemedicine).  Patients are able to view lab/test results, encounter notes, upcoming appointments, etc.  Non-urgent messages can be sent to your provider as well.   To learn more about what you can do with MyChart, go to [email or web address].    Your next appointment:   As needed  The format for your next appointment:   In Person  Provider:   Larae Grooms, MD     Other Instructions    Important Information About Sugar

## 2023-01-01 NOTE — Progress Notes (Signed)
Cardiology Office Note   Date:  01/01/2023   ID:  Jennifer Leonard, DOB 1971-05-02, MRN [id number]  PCP:  Carlena Hurl, PA-C    No chief complaint on file.  Hypertension, palpitations  Wt Readings from Last 3 Encounters:  01/01/23 181 lb (82.1 kg)  12/09/22 177 lb 3.2 oz (80.4 kg)  04/06/22 200 lb 12.8 oz (91.1 kg)       History of Present Illness: Jennifer Leonard is a 52 y.o. female who is being seen today for the evaluation of hypertension and palpitations at the request of Carlena Hurl, PA-C.   Records from primary care doctor show: "Here for concerns about BP.  She has a history of hypertension, impaired fasting glucose.  Earlier in the year after losing some weight and working on health lifestyle changes, she had quick taking BP medications and BPs had been doing well off medication for months.   Hasn't been checking BPs in general after normal readings 01/2022.   However, this week hasn't been feeling good.   She went to urgent care a few days ago for the this, and BP was elevated.  She had some left over amlodipine and did take one yesterday but hasn't been taking this in general.  She saw OB/gyn for annual in September and BP was fine then as well.   Been exercising, has lost a lot of weight intentionally this year.  No alcohol, nonsmoker.   Trying to keep on track of health.  "  Amlodipine was restarted.  She was instructed to monitor her blood pressures.  TSH was checked due to palpitations.    65 lb weight loss from 10/2021 -Jan 2024 Linus Salmons). Exercise increased in 8/23.    Since the visit with PCP, she feels better.    Most recent blood pressures have been in the 102-585I range systolic. Diastolics in the 77O.   Since the BP has come down, Denies : Chest pain. Dizziness. Leg edema. Nitroglycerin use. Orthopnea. Palpitations. Paroxysmal nocturnal dyspnea. Shortness of breath. Syncope.    Exercise capacity has come back to normal.   Past Medical  History:  Diagnosis Date   Anxiety    GERD (gastroesophageal reflux disease)    History of mammogram    through gyn   Hypertension    Routine gynecological examination    Dr. Helane Rima   Wears glasses    astigmatism    Past Surgical History:  Procedure Laterality Date   BREAST BIOPSY     benign   CHOLECYSTECTOMY     COLONOSCOPY  2011   benign polyp, Dr. Kennedy Bucker   WISDOM TOOTH EXTRACTION       Current Outpatient Medications  Medication Sig Dispense Refill   amLODipine (NORVASC) 10 MG tablet Take 1 tablet (10 mg total) by mouth daily. 90 tablet 0   B Complex-C (B-COMPLEX WITH VITAMIN C) tablet Take 1 tablet by mouth daily.     cholecalciferol (VITAMIN D3) 25 MCG (1000 UNIT) tablet Take 1,000 Units by mouth daily.     ibuprofen (ADVIL) 200 MG tablet Take 400 mg by mouth every 6 (six) hours as needed.     Simethicone (GAS-X PO) Take by mouth.     Docusate Sodium (STOOL SOFTENER LAXATIVE PO) Take by mouth. (Patient not taking: Reported on 12/09/2022)     esomeprazole (NEXIUM) 20 MG capsule Take 20 mg daily at 12 noon by mouth. (Patient not taking: Reported on 02/18/2022)     hydrochlorothiazide (HYDRODIURIL)  25 MG tablet Take 1 tablet (25 mg total) by mouth daily. (Patient not taking: Reported on 02/18/2022) 90 tablet 0   potassium chloride (KLOR-CON M) 10 MEQ tablet Take 1 tablet (10 mEq total) by mouth daily. (Patient not taking: Reported on 02/18/2022) 90 tablet 0   No current facility-administered medications for this visit.    Allergies:   Lidocaine-epinephrine and Sulfonamide derivatives    Social History:  The patient  reports that she has never smoked. She has never used smokeless tobacco. She reports current alcohol use. She reports that she does not use drugs.   Family History:  The patient's family history includes Anxiety disorder in her mother; Breast cancer in her maternal aunt and paternal grandmother; Cancer in her maternal aunt, paternal grandfather, paternal  grandmother, and paternal uncle; Colon polyps in her father; Depression in her father, mother, and sister; Diabetes in her mother; Heart disease in her maternal grandmother; Heart disease (age of onset: 56) in her maternal grandfather; Hyperlipidemia in her father; Hypertension in her father and mother; Seizures in her sister.    ROS:  Please see the history of present illness.   Otherwise, review of systems are positive for palpitations- improved as the blood pressure improved.   All other systems are reviewed and negative.    PHYSICAL EXAM: VS:  BP 110/82   Pulse 64   Ht '5\' 3"'$  (1.6 m)   Wt 181 lb (82.1 kg)   LMP 11/27/2022   SpO2 99%   BMI 32.06 kg/m  , BMI Body mass index is 32.06 kg/m. GEN: Well nourished, well developed, in no acute distress HEENT: normal Neck: no JVD, carotid bruits, or masses Cardiac: RRR; no murmurs, rubs, or gallops,no edema  Respiratory:  clear to auscultation bilaterally, normal work of breathing GI: soft, nontender, nondistended, + BS MS: no deformity or atrophy Skin: warm and dry, no rash Neuro:  Strength and sensation are intact Psych: euthymic mood, full affect   EKG:   The ekg ordered today demonstrates NSR, no ST changes   Recent Labs: 02/18/2022: ALT 39; BUN 12; Creatinine, Ser 0.80; Hemoglobin 13.3; Platelets 221; Potassium 4.1; Sodium 142 12/09/2022: TSH 0.696   Lipid Panel    Component Value Date/Time   CHOL 151 02/18/2022 1536   TRIG 80 02/18/2022 1536   HDL 45 02/18/2022 1536   CHOLHDL 3.4 02/18/2022 1536   CHOLHDL 3.2 11/01/2017 1341   VLDL 41 (H) 07/13/2016 0824   LDLCALC 91 02/18/2022 1536   LDLCALC 113 (H) 11/01/2017 1341     Other studies Reviewed: Additional studies/ records that were reviewed today with results demonstrating: labs reviewed.  February 2023 LDL 91 triglycerides 80 HDL 45 total cholesterol 151   ASSESSMENT AND PLAN:  Hypertension: Better since restarting amlodipine.  No longer on HCTZ. Avoiding salt ,  avoiding excess caffeine, avoiding alcohol will be helpful for keeping the blood pressure under control along with regular exercise. Palpitations: No need for any monitor at this time since the symptoms of gotten better. Lipids well controlled.  TSH decreased from prior but still normal.     Current medicines are reviewed at length with the patient today.  The patient concerns regarding her medicines were addressed.  The following changes have been made:  No change  Labs/ tests ordered today include:  No orders of the defined types were placed in this encounter.   Recommend 150 minutes/week of aerobic exercise Low fat, low carb, high fiber diet recommended  Disposition:  FU in as needed   Signed, Larae Grooms, MD  01/01/2023 10:07 AM    [address], [place], [place]  [address] Phone: [phone]; Fax: [phone]

## 2023-01-04 NOTE — Addendum Note (Signed)
Addended by: Janan Halter F on: 01/04/2023 07:47 PM   Modules accepted: Orders

## 2023-02-07 IMAGING — MG MM BREAST BX W LOC DEV 1ST LESION IMAGE BX SPEC STEREO GUIDE*L*
8 of 13 series · 8 of 29 positions shown · non-contrast
Comparison: Previous exams.
COMPARISON: Previous exams.

Addendum:
CLINICAL DATA: Screening detected asymmetry the LEFT breast at
POSTERIOR depth, most conspicuous on the CC view, without
sonographic correlate.

EXAM:
LEFT BREAST STEREOTACTIC CORE NEEDLE BIOPSY

[L (1 of 8)]
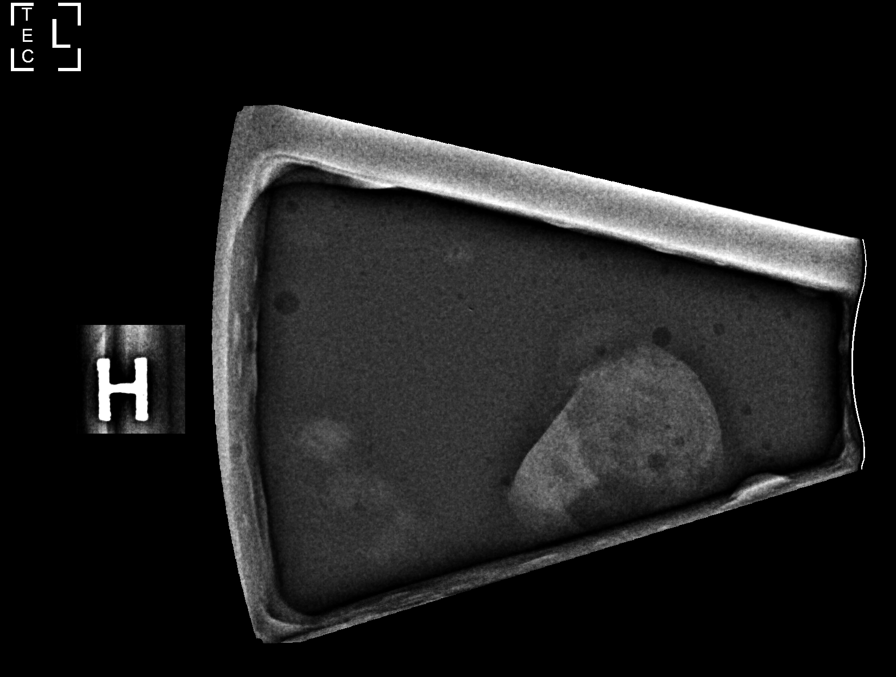

[L (2 of 8)]
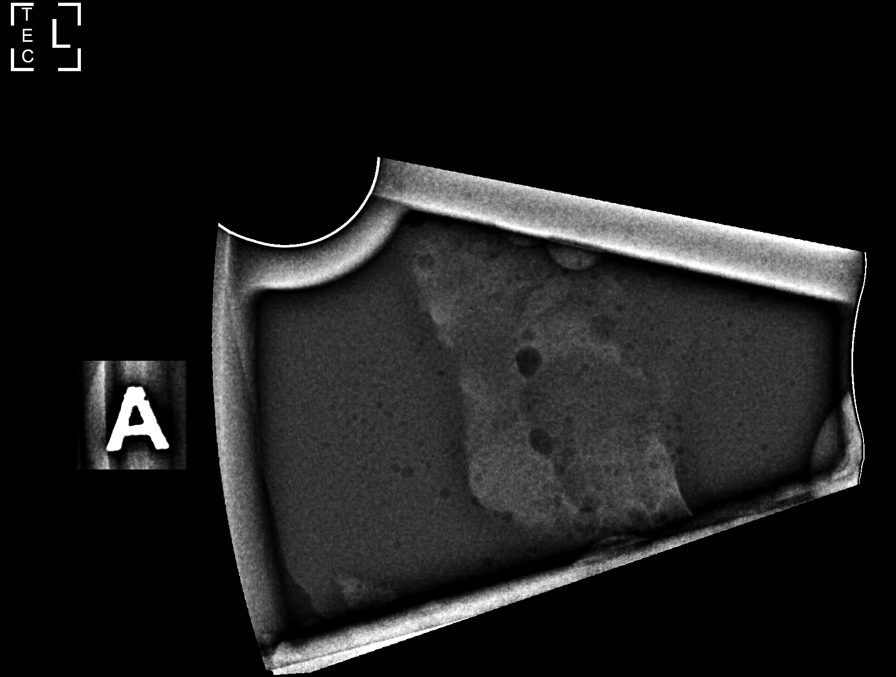

[L (3 of 8)]
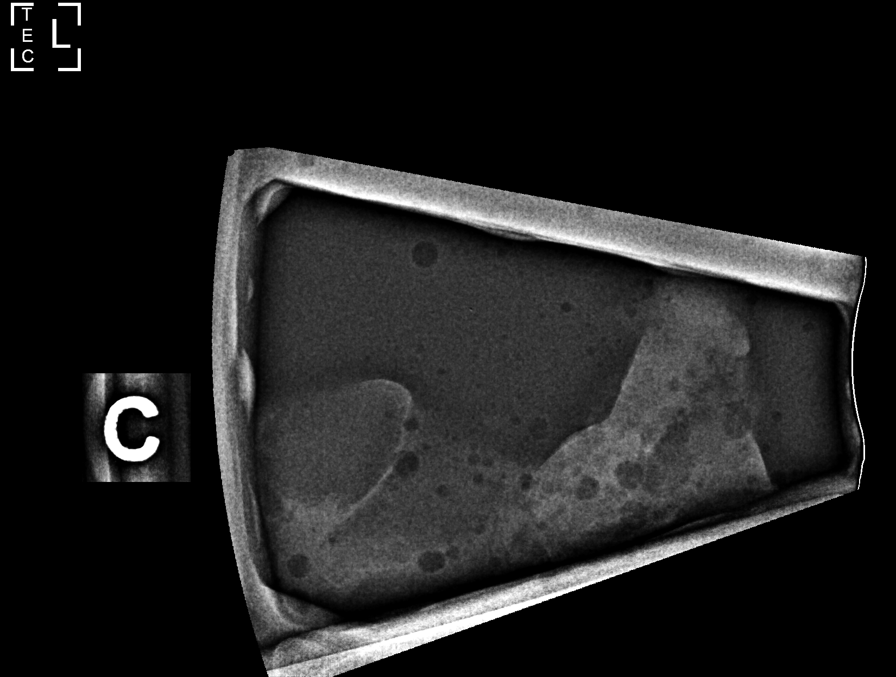

[L (4 of 8)]
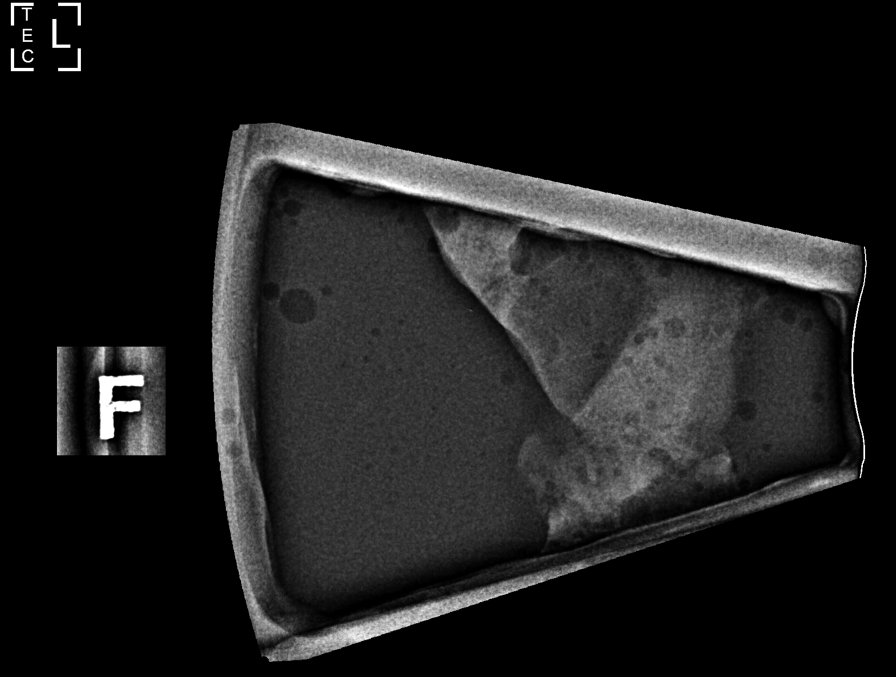

[L (5 of 8)]
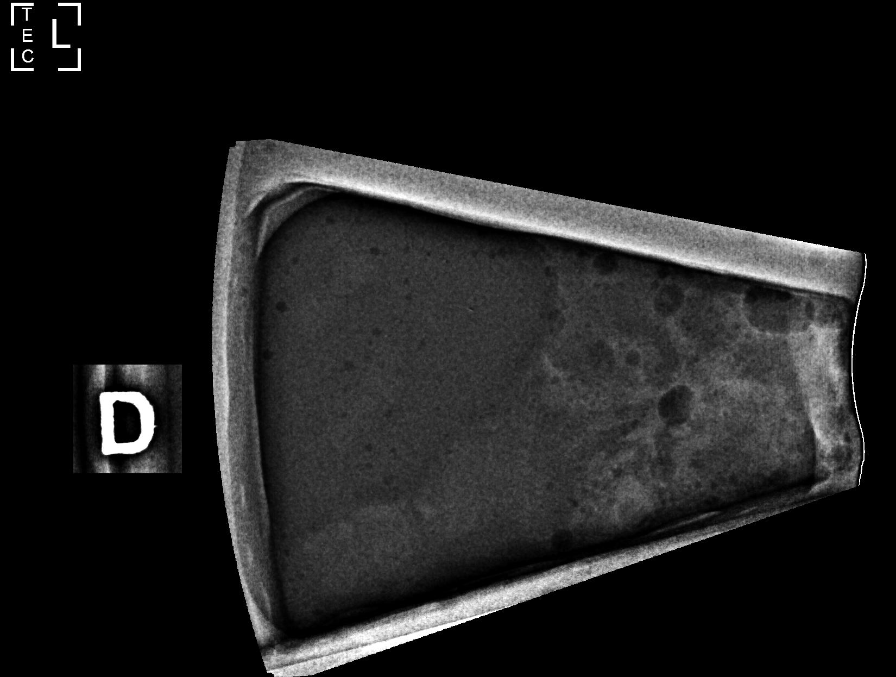

[L (6 of 8)]
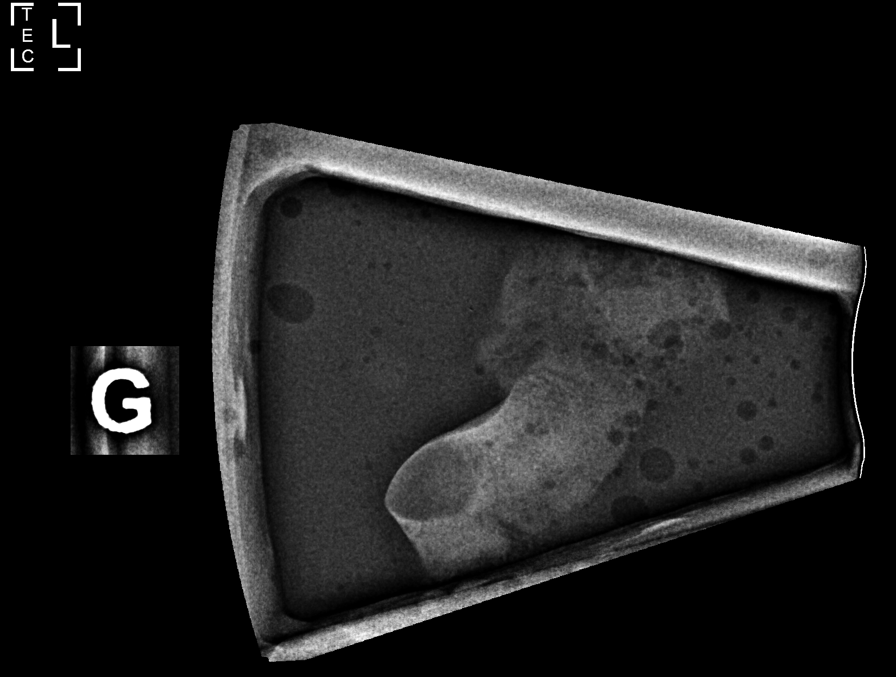

[L (7 of 8)]
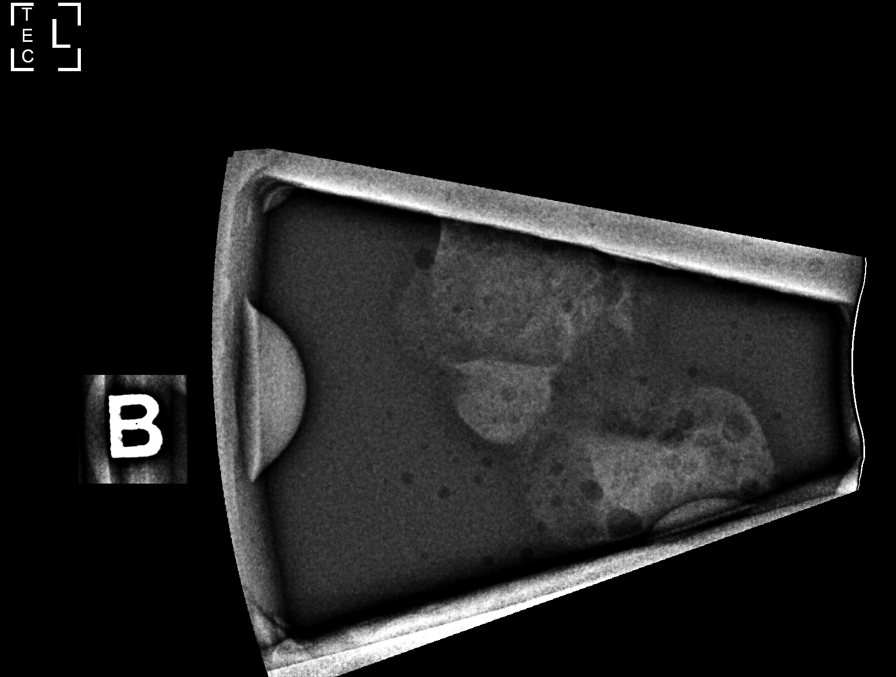

[L (8 of 8)]
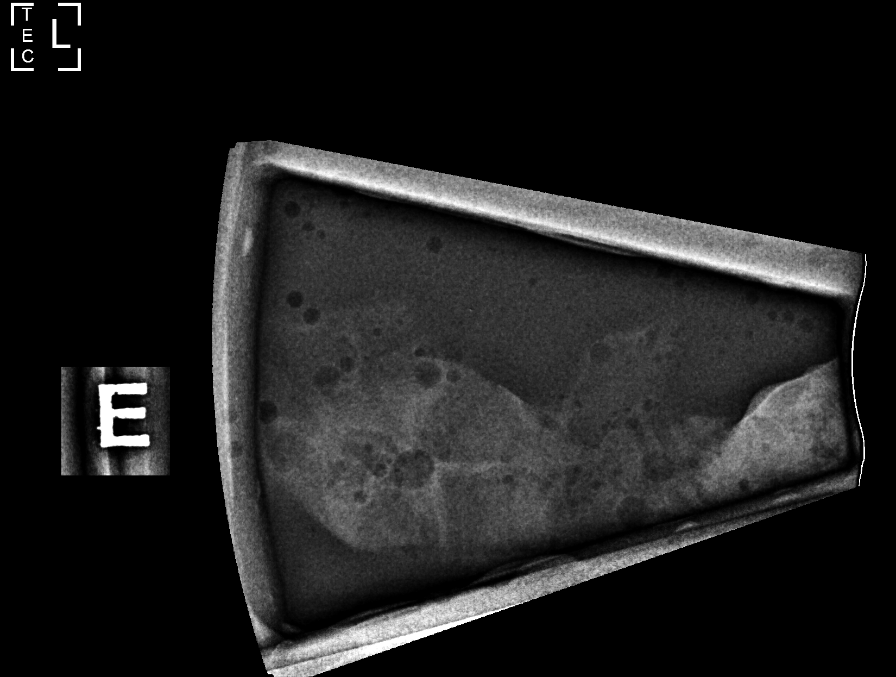

[8 of 29 positions shown; findings below may reference images not displayed]



Lesion quadrant: UPPER OUTER QUADRANT (as determined the post biopsy
clip images).

Using sterile technique with chlorhexidine as skin antisepsis, 1%
lidocaine as local anesthetic, under stereotactic guidance, a 9
gauge Brevera vacuum assisted device was used to perform core needle
biopsy of the asymmetry in the outer LEFT breast using a superior
approach. Specimen radiograph was performed showing soft tissue
density in multiple core samples.

At the conclusion of the procedure, a coil shaped tissue marker clip
was deployed into the biopsy cavity. Follow-up 2-view mammogram was
performed and dictated separately.
IMPRESSION: Stereotactic-guided biopsy of an asymmetry in the outer LEFT breast.
No apparent complications.

The patient states that she had palpitations when previously given
lidocaine with epinephrine at the dentist office, so only 1%
lidocaine was utilized for local anesthesia. The patient tolerated
the 1% lidocaine with no complications.

ADDENDUM:
Pathology revealed PSEUDOANGIOMATOUS STROMAL HYPERPLASIA (PASH) of
the Left breast, outer. This was found to be concordant by Dr. Amoroso
Sambo.

Pathology results were discussed with the patient by telephone. The
patient reported doing well after the biopsy with tenderness at the
site. Post biopsy instructions and care were reviewed and questions
were answered. The patient was encouraged to call The [REDACTED]

The patient was instructed to return for annual screening
mammography at [REDACTED].

Pathology results reported by Podolsky Losu, RN on 04/14/2021.



Lesion quadrant: UPPER OUTER QUADRANT (as determined the post biopsy
clip images).

Using sterile technique with chlorhexidine as skin antisepsis, 1%
lidocaine as local anesthetic, under stereotactic guidance, a 9
gauge Brevera vacuum assisted device was used to perform core needle
biopsy of the asymmetry in the outer LEFT breast using a superior
approach. Specimen radiograph was performed showing soft tissue
density in multiple core samples.

At the conclusion of the procedure, a coil shaped tissue marker clip
was deployed into the biopsy cavity. Follow-up 2-view mammogram was
performed and dictated separately.
IMPRESSION: Stereotactic-guided biopsy of an asymmetry in the outer LEFT breast.
No apparent complications.

The patient states that she had palpitations when previously given
lidocaine with epinephrine at the dentist office, so only 1%
lidocaine was utilized for local anesthesia. The patient tolerated
the 1% lidocaine with no complications.

## 2023-02-19 ENCOUNTER — Encounter: Payer: BC Managed Care – PPO | Admitting: Medical

## 2023-02-24 ENCOUNTER — Encounter: Payer: Self-pay | Admitting: Medical

## 2023-02-24 ENCOUNTER — Ambulatory Visit (INDEPENDENT_AMBULATORY_CARE_PROVIDER_SITE_OTHER): Payer: 59 | Admitting: Medical

## 2023-02-24 VITALS — BP 120/74 | HR 56 | Ht 64.0 in | Wt 181.0 lb

## 2023-02-24 DIAGNOSIS — I10 Essential (primary) hypertension: Secondary | ICD-10-CM | POA: Diagnosis not present

## 2023-02-24 DIAGNOSIS — Z131 Encounter for screening for diabetes mellitus: Secondary | ICD-10-CM

## 2023-02-24 DIAGNOSIS — R7301 Impaired fasting glucose: Secondary | ICD-10-CM | POA: Diagnosis not present

## 2023-02-24 DIAGNOSIS — Z0000 Encounter for general adult medical examination without abnormal findings: Secondary | ICD-10-CM | POA: Diagnosis not present

## 2023-02-24 DIAGNOSIS — Z7185 Encounter for immunization safety counseling: Secondary | ICD-10-CM

## 2023-02-24 DIAGNOSIS — Z1329 Encounter for screening for other suspected endocrine disorder: Secondary | ICD-10-CM | POA: Diagnosis not present

## 2023-02-24 DIAGNOSIS — Z13220 Encounter for screening for lipoid disorders: Secondary | ICD-10-CM

## 2023-02-24 LAB — POCT URINALYSIS DIP (PROADVANTAGE DEVICE)
Bilirubin, UA: NEGATIVE
Glucose, UA: NEGATIVE mg/dL
Ketones, POC UA: NEGATIVE mg/dL
Nitrite, UA: NEGATIVE
Protein Ur, POC: NEGATIVE mg/dL
Specific Gravity, Urine: 1.015
Urobilinogen, Ur: 0.2
pH, UA: 6 (ref 5.0–8.0)

## 2023-02-24 NOTE — Progress Notes (Signed)
Subjective:   HPI  Jennifer Leonard is a 52 y.o. female who presents for Chief Complaint  Patient presents with   Annual Exam    Fasting annual exam. Had a period 11/27/22 and then started again 2 days ago. Would like to discuss menopause. Has form to be filled out. Has some ankle swelling.     Patient Care Team: Cinderella Christoffersen, Leward Quan as PCP - General (Family Medicine) Jettie Booze, MD as PCP - Cardiology (Cardiology) Sees dentist Sees eye doctor Dr. Lucio Edward, GI Dr. Dian Queen, gyn   Concerns: Martin Majestic to cardiologist recently, everything checked out fine.  Exercising with kickboxing, circuit weights, cardio   Reviewed their medical, surgical, family, social, medication, and allergy history and updated chart as appropriate.  Past Medical History:  Diagnosis Date   Anxiety    GERD (gastroesophageal reflux disease)    History of mammogram    through gyn   Hypertension    Routine gynecological examination    Dr. Helane Rima   Shingles 2021   Wears glasses    astigmatism    Family History  Problem Relation Age of Onset   Diabetes Mother    Hypertension Mother    Depression Mother    Anxiety disorder Mother    Colon polyps Father    Hypertension Father    Hyperlipidemia Father    Depression Father    Seizures Sister    Depression Sister    Cancer Maternal Aunt        breast   Breast cancer Maternal Aunt    Cancer Paternal Uncle        melanoma   Heart disease Maternal Grandmother        valve disease   Heart disease Maternal Grandfather 57       MI   Cancer Paternal Grandmother        breast   Breast cancer Paternal Grandmother    Cancer Paternal Grandfather        lung   Stroke Neg Hx    Colon cancer Neg Hx    Esophageal cancer Neg Hx    Rectal cancer Neg Hx    Stomach cancer Neg Hx      Current Outpatient Medications:    amLODipine (NORVASC) 10 MG tablet, Take 1 tablet (10 mg total) by mouth daily., Disp: 90 tablet, Rfl: 0   B  Complex-C (B-COMPLEX WITH VITAMIN C) tablet, Take 1 tablet by mouth daily., Disp: , Rfl:    cholecalciferol (VITAMIN D3) 25 MCG (1000 UNIT) tablet, Take 1,000 Units by mouth daily., Disp: , Rfl:   Allergies  Allergen Reactions   Lidocaine-Epinephrine Palpitations    Patient tolerated 1% lidocaine without difficulty.   Sulfonamide Derivatives Rash   Review of Systems  Constitutional:  Negative for chills, fever, malaise/fatigue and weight loss.  HENT:  Negative for congestion, ear pain, hearing loss, sore throat and tinnitus.   Eyes:  Negative for blurred vision, pain and redness.  Respiratory:  Negative for cough, hemoptysis and shortness of breath.   Cardiovascular:  Negative for chest pain, palpitations, orthopnea, claudication and leg swelling.  Gastrointestinal:  Negative for abdominal pain, blood in stool, constipation, diarrhea, nausea and vomiting.  Genitourinary:  Negative for dysuria, flank pain, frequency, hematuria and urgency.  Musculoskeletal:  Negative for falls, joint pain and myalgias.  Skin:  Negative for itching and rash.  Neurological:  Negative for dizziness, tingling, speech change, weakness and headaches.  Endo/Heme/Allergies:  Negative for polydipsia. Does  not bruise/bleed easily.  Psychiatric/Behavioral:  Negative for depression and memory loss. The patient is not nervous/anxious and does not have insomnia.         02/24/2023    8:58 AM 02/18/2022    2:16 PM 08/19/2020    1:53 PM 05/31/2019   10:33 AM 11/15/2017   11:43 AM  Depression screen PHQ 2/9  Decreased Interest 0 0 3 0 0  Down, Depressed, Hopeless 0 0 3 1 0  PHQ - 2 Score 0 0 6 1 0  Altered sleeping  0 2    Tired, decreased energy  0 3    Change in appetite  0 1    Feeling bad or failure about yourself   0 3    Trouble concentrating  0 0    Moving slowly or fidgety/restless  0 0    Suicidal thoughts  0 2    PHQ-9 Score  0 17    Difficult doing work/chores  Not difficult at all Very difficult          Objective:  BP 120/74   Pulse (!) 56   Ht '5\' 4"'$  (1.626 m)   Wt 181 lb (82.1 kg)   LMP 02/21/2023   SpO2 98%   BMI 31.07 kg/m   Wt Readings from Last 3 Encounters:  02/24/23 181 lb (82.1 kg)  01/01/23 181 lb (82.1 kg)  12/09/22 177 lb 3.2 oz (80.4 kg)   BP Readings from Last 3 Encounters:  02/24/23 120/74  01/01/23 110/82  12/09/22 (!) 148/100    General appearance: alert, no distress, WD/WN, Caucasian female Skin: scattered macules, no worrisome lesions HEENT: normocephalic, conjunctiva/corneas normal, sclerae anicteric, PERRLA, EOMi Neck: supple, no lymphadenopathy, no thyromegaly, no masses, normal ROM, no bruits Chest: non tender, normal shape and expansion Heart: RRR, normal S1, S2, no murmurs Lungs: CTA bilaterally, no wheezes, rhonchi, or rales Abdomen: +bs, soft, non tender, non distended, no masses, no hepatomegaly, no splenomegaly, no bruits Back: non tender, normal ROM, no scoliosis Musculoskeletal: upper extremities non tender, no obvious deformity, normal ROM throughout, lower extremities non tender, no obvious deformity, normal ROM throughout Extremities: no edema, no cyanosis, no clubbing Pulses: 2+ symmetric, upper and lower extremities, normal cap refill Neurological: alert, oriented x 3, CN2-12 intact, strength normal upper extremities and lower extremities, sensation normal throughout, DTRs 2+ throughout, no cerebellar signs, gait normal Psychiatric: normal affect, behavior normal, pleasant  Breast/gyn/rectal - deferred to gynecology    Assessment and Plan :   Encounter Diagnoses  Name Primary?   Encounter for health maintenance examination in adult Yes   Screening for thyroid disorder    Impaired fasting glucose    Essential hypertension    Vaccine counseling    Screening for lipid disorders    Screening for diabetes mellitus      This visit was a preventative care visit, also known as wellness visit or routine physical.   Topics  typically include healthy lifestyle, diet, exercise, preventative care, vaccinations, sick and well care, proper use of emergency dept and after hours care, as well as other concerns.     Recommendations: Continue to return yearly for your annual wellness and preventative care visits.  This gives [place] a chance to discuss healthy lifestyle, exercise, vaccinations, review your chart record, and perform screenings where appropriate.  I recommend you see your eye doctor yearly for routine vision care.  I recommend you see your dentist yearly for routine dental care including hygiene visits twice yearly.  Vaccination recommendations were reviewed Immunization History  Administered Date(s) Administered   Influenza,inj,Quad PF,6+ Mos 11/27/2017, 08/20/2018   Influenza-Unspecified 08/28/2018, 10/04/2021   Tdap 04/16/2014    Shingles vaccine:  I recommend you have a shingles vaccine to help prevent shingles or herpes zoster outbreak.   Please call your insurer to inquire about coverage for the Shingrix vaccine given in 2 doses.   Some insurers cover this vaccine after age 63, some cover this after age 43.  If your insurer covers this, then call to schedule appointment to have this vaccine here.   Screening for cancer: Colon cancer screening: I reviewed your colonoscopy on file that is up to date from 08/2021  Breast cancer screening: You should perform a self breast exam monthly.   We reviewed recommendations for regular mammograms and breast cancer screening.  Cervical cancer screening: We reviewed recommendations for pap smear screening.   Skin cancer screening: Check your skin regularly for new changes, growing lesions, or other lesions of concern Come in for evaluation if you have skin lesions of concern.  Lung cancer screening: If you have a greater than 20 pack year history of tobacco use, then you may qualify for lung cancer screening with a chest CT scan.   Please call your  insurance company to inquire about coverage for this test.  We currently don't have screenings for other cancers besides breast, cervical, colon, and lung cancers.  If you have a strong family history of cancer or have other cancer screening concerns, please let me know.    Bone health: Get at least 150 minutes of aerobic exercise weekly Get weight bearing exercise at least once weekly Bone density test:  A bone density test is an imaging test that uses a type of X-ray to measure the amount of calcium and other minerals in your bones. The test may be used to diagnose or screen you for a condition that causes weak or thin bones (osteoporosis), predict your risk for a broken bone (fracture), or determine how well your osteoporosis treatment is working. The bone density test is recommended for females 67 and older, or females or males [id number] if certain risk factors such as thyroid disease, long term use of steroids such as for asthma or rheumatological issues, vitamin D deficiency, estrogen deficiency, family history of osteoporosis, self or family history of fragility fracture in first degree relative.    Heart health: Get at least 150 minutes of aerobic exercise weekly Limit alcohol It is important to maintain a healthy blood pressure and healthy cholesterol numbers  Heart disease screening: Screening for heart disease includes screening for blood pressure, fasting lipids, glucose/diabetes screening, BMI height to weight ratio, reviewed of smoking status, physical activity, and diet.    Goals include blood pressure 120/80 or less, maintaining a healthy lipid/cholesterol profile, preventing diabetes or keeping diabetes numbers under good control, not smoking or using tobacco products, exercising most days per week or at least 150 minutes per week of exercise, and eating healthy variety of fruits and vegetables, healthy oils, and avoiding unhealthy food choices like fried food, fast food, high sugar  and high cholesterol foods.    Other tests may possibly include EKG test, CT coronary calcium score, echocardiogram, exercise treadmill stress test.     Medical care options: I recommend you continue to seek care here first for routine care.  We try really hard to have available appointments Monday through Friday daytime hours for sick visits, acute visits, and physicals.  Urgent  care should be used for after hours and weekends for significant issues that cannot wait till the next day.  The emergency department should be used for significant potentially life-threatening emergencies.  The emergency department is expensive, can often have long wait times for less significant concerns, so try to utilize primary care, urgent care, or telemedicine when possible to avoid unnecessary trips to the emergency department.  Virtual visits and telemedicine have been introduced since the pandemic started in 2020, and can be convenient ways to receive medical care.  We offer virtual appointments as well to assist you in a variety of options to seek medical care.   Advanced Directives: I recommend you consider completing a [place] and Living Will.   These documents respect your wishes and help alleviate burdens on your loved ones if you were to become terminally ill or be in a position to need those documents enforced.    You can complete Advanced Directives yourself, have them notarized, then have copies made for our office, for you and for anybody you feel should have them in safe keeping.  Or, you can have an attorney prepare these documents.   If you haven't updated your Last Will and Testament in a while, it may be worthwhile having an attorney prepare these documents together and save on some costs.       Separate significant issues discussed: Hypertension - continue Amlodipine '10mg'$  daily   [place] was seen today for annual exam.  Diagnoses and all orders for this  visit:  Encounter for health maintenance examination in adult -     Comprehensive metabolic panel -     CBC -     Lipid panel -     Hemoglobin A1c -     POCT Urinalysis DIP (Proadvantage Device) -     TSH + free T4  Screening for thyroid disorder -     TSH + free T4  Impaired fasting glucose -     Hemoglobin A1c  Essential hypertension  Vaccine counseling  Screening for lipid disorders -     Lipid panel  Screening for diabetes mellitus -     Hemoglobin A1c    Follow-up pending labs, yearly for physical

## 2023-02-25 ENCOUNTER — Other Ambulatory Visit: Payer: Self-pay | Admitting: Medical

## 2023-02-25 LAB — TSH+FREE T4
Free T4: 1.3 ng/dL (ref 0.82–1.77)
TSH: 1.96 u[IU]/mL (ref 0.450–4.500)

## 2023-02-25 LAB — LIPID PANEL
Chol/HDL Ratio: 3 ratio (ref 0.0–4.4)
Cholesterol, Total: 172 mg/dL (ref 100–199)
HDL: 57 mg/dL (ref >39)
LDL Chol Calc (NIH): 99 mg/dL (ref 0–99)
Triglycerides: 88 mg/dL (ref 0–149)
VLDL Cholesterol Cal: 16 mg/dL (ref 5–40)

## 2023-02-25 LAB — COMPREHENSIVE METABOLIC PANEL
ALT: 25 IU/L (ref 0–32)
AST: 19 IU/L (ref 0–40)
Albumin/Globulin Ratio: 1.7 (ref 1.2–2.2)
Albumin: 4.3 g/dL (ref 3.8–4.9)
Alkaline Phosphatase: 107 IU/L (ref 44–121)
BUN/Creatinine Ratio: 15 (ref 9–23)
BUN: 13 mg/dL (ref 6–24)
Bilirubin Total: 0.4 mg/dL (ref 0.0–1.2)
CO2: 24 mmol/L (ref 20–29)
Calcium: 9.4 mg/dL (ref 8.7–10.2)
Chloride: 102 mmol/L (ref 96–106)
Creatinine, Ser: 0.86 mg/dL (ref 0.57–1.00)
Globulin, Total: 2.5 g/dL (ref 1.5–4.5)
Glucose: 96 mg/dL (ref 70–99)
Potassium: 4.7 mmol/L (ref 3.5–5.2)
Sodium: 138 mmol/L (ref 134–144)
Total Protein: 6.8 g/dL (ref 6.0–8.5)
eGFR: 82 mL/min/{1.73_m2} (ref >59)

## 2023-02-25 LAB — HEMOGLOBIN A1C
Est. average glucose Bld gHb Est-mCnc: 111 mg/dL
Hgb A1c MFr Bld: 5.5 % (ref 4.8–5.6)

## 2023-02-25 LAB — CBC
Hematocrit: 39.6 % (ref 34.0–46.6)
Hemoglobin: 13 g/dL (ref 11.1–15.9)
MCH: 29.2 pg (ref 26.6–33.0)
MCHC: 32.8 g/dL (ref 31.5–35.7)
MCV: 89 fL (ref 79–97)
Platelets: 205 10*3/uL (ref 150–450)
RBC: 4.45 x10E6/uL (ref 3.77–5.28)
RDW: 12.6 % (ref 11.7–15.4)
WBC: 6.6 10*3/uL (ref 3.4–10.8)

## 2023-02-25 NOTE — Progress Notes (Signed)
Also, did the cardiologist refill amlodipine or does she need refill?

## 2023-02-25 NOTE — Progress Notes (Signed)
Results sent through MyChart

## 2023-02-26 ENCOUNTER — Other Ambulatory Visit: Payer: Self-pay | Admitting: Medical

## 2023-02-26 MED ORDER — AMLODIPINE BESYLATE 10 MG PO TABS: 10.0000 mg | ORAL_TABLET | Freq: Every day | ORAL | 3 refills | Status: DC

## 2023-04-07 ENCOUNTER — Other Ambulatory Visit: Payer: Self-pay | Admitting: Medical

## 2023-04-07 ENCOUNTER — Telehealth: Payer: Self-pay | Admitting: Medical

## 2023-04-07 MED ORDER — AMLODIPINE BESYLATE 5 MG PO TABS: 5.0000 mg | ORAL_TABLET | Freq: Every day | ORAL | 2 refills | Status: DC

## 2023-04-07 NOTE — Telephone Encounter (Signed)
Marchelle Folks from [facility] called and states Jennifer Leonard has been getting a low pulse like in the 30s taking 10 mg amlodipine so she is requesting if she can go down to 5 mg instead. If you are ok with this, she would like it sent back to [facility].

## 2023-04-07 NOTE — Telephone Encounter (Signed)
Pt weight is 170lb. She is drinking 80-100 oz of water daily. Her pulse is in the low 40's. She is not having any dizziness or headaches. Her 10mg  is not scored so its hard for her to cut it to 5mg , so she will need a new rx for 5mg .

## 2023-04-07 NOTE — Telephone Encounter (Signed)
Pt was notified.  

## 2023-04-13 NOTE — Telephone Encounter (Signed)
Reviewed with Dr Eldridge Dace and OK to order 2 week zio XT if patient having symptoms or concerns with low heart rate.  I spoke with patient.  She reports she feels fine. No dizziness.Heart rate usually in the 40's.   No symptoms.  Reports heart rate has been running in the 40's for a long time.   She monitors with her Apple watch.  One alert of 39 while she was sleeping.  Heart rate goes up with activity/exercise.  Patient does not feel she needs monitor at this time.  She will continue to monitor with her Apple watch and let us know if she would like to wear monitor.  I told her she could send in EKG readings through my chart if having low heart rates.  Appointment made for patient to see Tereso Newcomer, PA on June 18.

## 2023-05-20 ENCOUNTER — Other Ambulatory Visit: Payer: Self-pay | Admitting: Medical

## 2023-06-11 ENCOUNTER — Ambulatory Visit: Payer: 59 | Admitting: Internal Medicine

## 2023-06-11 ENCOUNTER — Encounter: Payer: Self-pay | Admitting: Internal Medicine

## 2023-06-11 VITALS — BP 160/100 | HR 50 | Temp 98.2°F | Ht 64.0 in | Wt 168.0 lb

## 2023-06-11 DIAGNOSIS — R7301 Impaired fasting glucose: Secondary | ICD-10-CM | POA: Diagnosis not present

## 2023-06-11 DIAGNOSIS — I10 Essential (primary) hypertension: Secondary | ICD-10-CM

## 2023-06-11 DIAGNOSIS — Z808 Family history of malignant neoplasm of other organs or systems: Secondary | ICD-10-CM | POA: Diagnosis not present

## 2023-06-11 MED ORDER — AMLODIPINE BESYLATE 5 MG PO TABS: 10.0000 mg | ORAL_TABLET | Freq: Every day | ORAL | 3 refills | Status: DC

## 2023-06-11 NOTE — Patient Instructions (Addendum)
We will increase amlodipine to 10 mg so take 2 of the 5 mg and let me know what the BP is running.

## 2023-06-11 NOTE — Assessment & Plan Note (Signed)
Increase amlodipine to 10 mg daily and she will take 2 of the 5 mg tablets. She is taking GLP-1 and this seems to have raised BP. If this lowers off glp-1 she will resume amlodipine 5 mg daily. She has ability to check at home.

## 2023-06-11 NOTE — Progress Notes (Signed)
   Subjective:   Patient ID: Jennifer Leonard, female    DOB: 1971-12-25, 52 y.o.   MRN: [id number]  HPI The patient is a new 52 female coming in for concerns.  PMH, [facility], social history reviewed and updated  Review of Systems  Constitutional: Negative.   HENT: Negative.    Eyes: Negative.   Respiratory:  Negative for cough, chest tightness and shortness of breath.   Cardiovascular:  Negative for chest pain, palpitations and leg swelling.  Gastrointestinal:  Negative for abdominal distention, abdominal pain, constipation, diarrhea, nausea and vomiting.  Musculoskeletal: Negative.   Skin: Negative.   Neurological: Negative.   Psychiatric/Behavioral: Negative.      Objective:  Physical Exam Constitutional:      Appearance: She is well-developed.  HENT:     Head: Normocephalic and atraumatic.  Cardiovascular:     Rate and Rhythm: Normal rate and regular rhythm.  Pulmonary:     Effort: Pulmonary effort is normal. No respiratory distress.     Breath sounds: Normal breath sounds. No wheezing or rales.  Abdominal:     General: Bowel sounds are normal. There is no distension.     Palpations: Abdomen is soft.     Tenderness: There is no abdominal tenderness. There is no rebound.  Musculoskeletal:     Cervical back: Normal range of motion.  Skin:    General: Skin is warm and dry.  Neurological:     Mental Status: She is alert and oriented to person, place, and time.     Coordination: Coordination normal.     Vitals:   06/11/23 1355 06/11/23 1357  BP: (!) 160/100 (!) 160/100  Pulse: (!) 50   Temp: 98.2 F (36.8 C)   TempSrc: Oral   SpO2: 98%   Weight: 168 lb (76.2 kg)   Height: 5\' 4"  (1.626 m)     Assessment & Plan:

## 2023-06-11 NOTE — Assessment & Plan Note (Signed)
Skin check done of several lesions.

## 2023-06-11 NOTE — Assessment & Plan Note (Signed)
Recent levels stable and has lost significant amount of weight with diet.

## 2023-06-15 ENCOUNTER — Ambulatory Visit: Payer: 59 | Admitting: Physician Assistant

## 2024-01-18 NOTE — Progress Notes (Unsigned)
    Aleen Sells [name] [address] [place] [address] Phone: [phone]   Assessment and Plan:     There are no diagnoses linked to this encounter.  ***   Pertinent previous records reviewed include ***    Follow Up: ***     Subjective:   I, Salim Forero, am serving as a Neurosurgeon for Doctor Richardean Sale  Chief Complaint: back pain   HPI:   01/19/2024 Patient is a 53 year old female with back pain. Patient states   Relevant Historical Information: ***  Additional pertinent review of systems negative.   Current Outpatient Medications:    amLODipine (NORVASC) 5 MG tablet, Take 2 tablets (10 mg total) by mouth daily., Disp: 180 tablet, Rfl: 3   B Complex-C (B-COMPLEX WITH VITAMIN C) tablet, Take 1 tablet by mouth daily., Disp: , Rfl:    cholecalciferol (VITAMIN D3) 25 MCG (1000 UNIT) tablet, Take 1,000 Units by mouth daily., Disp: , Rfl:    Objective:     There were no vitals filed for this visit.    There is no height or weight on file to calculate BMI.    Physical Exam:    ***   Electronically signed by:  Aleen Sells [name] 11:57 AM 01/18/24

## 2024-01-19 ENCOUNTER — Ambulatory Visit: Payer: 59 | Admitting: Sports Medicine

## 2024-01-19 VITALS — HR 75 | Ht 64.0 in | Wt 157.0 lb

## 2024-01-19 DIAGNOSIS — M545 Low back pain, unspecified: Secondary | ICD-10-CM

## 2024-01-19 MED ORDER — MELOXICAM 15 MG PO TABS: 15.0000 mg | ORAL_TABLET | Freq: Every day | ORAL | 0 refills | Status: AC

## 2024-01-19 NOTE — Patient Instructions (Signed)
-   Start meloxicam 15 mg daily x2 weeks.  If still having pain after 2 weeks, complete 3rd-week of NSAID. May use remaining NSAID as needed once daily for pain control.  Do not to use additional over-the-counter NSAIDs (ibuprofen, naproxen, Advil, Aleve) while taking prescription NSAIDs.  May use Tylenol [id number] mg 2 to 3 times a day for breakthrough pain. Low back HEP  4 week follow up

## 2024-02-15 NOTE — Progress Notes (Deleted)
    Jennifer Leonard D.[name] [address] [place] [address] Phone: [phone]   Assessment and Plan:     There are no diagnoses linked to this encounter.  ***   Pertinent previous records reviewed include ***    Follow Up: ***     Subjective:   I, Jennifer Leonard, am serving as a Neurosurgeon for Jennifer Leonard   Chief Complaint: back pain    HPI:    01/19/2024 Patient is a 53 year old female with back pain. 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 She was recently sick and hasn't come back to running. Has pain when she flexes. If she sits on a bleacher or a hard seat she will have pain. Does have some radiating pain down the back. Has pain when rolling over in  bed. Pain when getting up from a seated position.   02/16/2024 Patient states   Relevant Historical Information: Hypertension, GERD  Additional pertinent review of systems negative.   Current Outpatient Medications:    amLODipine (NORVASC) 5 MG tablet, Take 2 tablets (10 mg total) by mouth daily., Disp: 180 tablet, Rfl: 3   B Complex-C (B-COMPLEX WITH VITAMIN C) tablet, Take 1 tablet by mouth daily., Disp: , Rfl:    cholecalciferol (VITAMIN D3) 25 MCG (1000 UNIT) tablet, Take 1,000 Units by mouth daily., Disp: , Rfl:    meloxicam (MOBIC) 15 MG tablet, Take 1 tablet (15 mg total) by mouth daily., Disp: 30 tablet, Rfl: 0   Objective:     There were no vitals filed for this visit.    There is no height or weight on file to calculate BMI.    Physical Exam:    ***   Electronically signed by:  Jennifer Leonard [name] 7:28 AM 02/15/24

## 2024-02-16 ENCOUNTER — Ambulatory Visit: Payer: 59 | Admitting: Sports Medicine

## 2024-02-28 ENCOUNTER — Encounter: Payer: Self-pay | Admitting: Internal Medicine

## 2024-02-28 ENCOUNTER — Ambulatory Visit (INDEPENDENT_AMBULATORY_CARE_PROVIDER_SITE_OTHER): Payer: 59 | Admitting: Internal Medicine

## 2024-02-28 VITALS — BP 120/80 | HR 56 | Temp 97.5°F | Ht 64.0 in | Wt 183.0 lb

## 2024-02-28 DIAGNOSIS — R7301 Impaired fasting glucose: Secondary | ICD-10-CM

## 2024-02-28 DIAGNOSIS — Z0000 Encounter for general adult medical examination without abnormal findings: Secondary | ICD-10-CM | POA: Diagnosis not present

## 2024-02-28 DIAGNOSIS — I10 Essential (primary) hypertension: Secondary | ICD-10-CM

## 2024-02-28 LAB — COMPREHENSIVE METABOLIC PANEL
ALT: 26 U/L (ref 0–35)
AST: 22 U/L (ref 0–37)
Albumin: 4.6 g/dL (ref 3.5–5.2)
Alkaline Phosphatase: 85 U/L (ref 39–117)
BUN: 17 mg/dL (ref 6–23)
CO2: 26 meq/L (ref 19–32)
Calcium: 9.6 mg/dL (ref 8.4–10.5)
Chloride: 105 meq/L (ref 96–112)
Creatinine, Ser: 0.75 mg/dL (ref 0.40–1.20)
GFR: 91.45 mL/min (ref >60.00)
Glucose, Bld: 99 mg/dL (ref 70–99)
Potassium: 4.4 meq/L (ref 3.5–5.1)
Sodium: 140 meq/L (ref 135–145)
Total Bilirubin: 0.6 mg/dL (ref 0.2–1.2)
Total Protein: 7.5 g/dL (ref 6.0–8.3)

## 2024-02-28 LAB — LIPID PANEL
Cholesterol: 212 mg/dL — ABNORMAL HIGH (ref 0–200)
HDL: 61.1 mg/dL (ref >39.00)
LDL Cholesterol: 138 mg/dL — ABNORMAL HIGH (ref 0–99)
NonHDL: 150.52
Total CHOL/HDL Ratio: 3
Triglycerides: 64 mg/dL (ref 0.0–149.0)
VLDL: 12.8 mg/dL (ref 0.0–40.0)

## 2024-02-28 LAB — CBC
HCT: 42.4 % (ref 36.0–46.0)
Hemoglobin: 14.3 g/dL (ref 12.0–15.0)
MCHC: 33.8 g/dL (ref 30.0–36.0)
MCV: 87.9 fl (ref 78.0–100.0)
Platelets: 223 10*3/uL (ref 150.0–400.0)
RBC: 4.83 Mil/uL (ref 3.87–5.11)
RDW: 13 % (ref 11.5–15.5)
WBC: 6.1 10*3/uL (ref 4.0–10.5)

## 2024-02-28 LAB — HEMOGLOBIN A1C: Hgb A1c MFr Bld: 5.4 % (ref 4.6–6.5)

## 2024-02-28 MED ORDER — AMLODIPINE BESYLATE 5 MG PO TABS: 10.0000 mg | ORAL_TABLET | Freq: Every day | ORAL | 3 refills | Status: AC

## 2024-02-28 NOTE — Progress Notes (Unsigned)
   Subjective:   Patient ID: Jennifer Leonard, female    DOB: 09-17-1971, 53 y.o.   MRN: [id number]  HPI The patient is here for physical.  PMH, [facility], social history reviewed and updated  Review of Systems  Objective:  Physical Exam  Vitals:   02/28/24 0819  BP: 120/80  Pulse: (!) 56  Temp: (!) 97.5 F (36.4 C)  TempSrc: Oral  SpO2: 96%  Weight: 183 lb (83 kg)  Height: 5\' 4"  (1.626 m)    Assessment & Plan:

## 2024-02-29 ENCOUNTER — Encounter: Payer: Self-pay | Admitting: Internal Medicine

## 2024-03-02 NOTE — Assessment & Plan Note (Signed)
Flu shot up to date. Shingrix counseled. Tetanus up to date. Colonoscopy up to date. Mammogram up to date with gyn, pap smear up to date with gyn. Counseled about sun safety and mole surveillance. Counseled about the dangers of distracted driving. Given 10 year screening recommendations.

## 2024-03-02 NOTE — Assessment & Plan Note (Signed)
 Checking HGA1c and adjust as needed.

## 2024-03-02 NOTE — Assessment & Plan Note (Signed)
 Continue amlodipine 10 mg daily and refilled. Checking CMP and CBC and adjust as needed.

## 2024-03-08 ENCOUNTER — Ambulatory Visit: Payer: 59 | Admitting: Sports Medicine

## 2024-09-17 ENCOUNTER — Encounter: Payer: Self-pay | Admitting: Internal Medicine

## 2024-09-20 MED ORDER — LOSARTAN POTASSIUM 25 MG PO TABS: 25.0000 mg | ORAL_TABLET | Freq: Every day | ORAL | 1 refills | Status: AC

## 2024-09-20 NOTE — Addendum Note (Signed)
 Addended by: ROLLENE ALMARIE LABOR on: 09/20/2024 09:32 AM   Modules accepted: Orders

## 2025-02-13 ENCOUNTER — Ambulatory Visit: Admitting: Internal Medicine

## 2025-02-28 ENCOUNTER — Encounter: Admitting: Internal Medicine
